# Patient Record
Sex: Male | Born: 1975 | Race: White | Hispanic: No | Marital: Married | Smoking: Never smoker
Health system: Southern US, Community
[De-identification: ages and names within clinical notes are randomized; demographics above are authoritative.]

## PROBLEM LIST (undated history)

## (undated) DIAGNOSIS — I10 Essential (primary) hypertension: Secondary | ICD-10-CM

## (undated) HISTORY — PX: APPENDECTOMY: SHX54

## (undated) HISTORY — PX: TONSILLECTOMY: SUR1361

---

## 2006-08-08 ENCOUNTER — Emergency Department: Payer: Self-pay | Admitting: Emergency Medicine

## 2006-08-17 ENCOUNTER — Emergency Department: Payer: Self-pay | Admitting: Emergency Medicine

## 2007-10-17 ENCOUNTER — Ambulatory Visit: Payer: Self-pay | Admitting: Family Medicine

## 2008-06-01 ENCOUNTER — Ambulatory Visit: Payer: Self-pay | Admitting: Gastroenterology

## 2008-06-01 LAB — HM COLONOSCOPY

## 2010-05-24 ENCOUNTER — Emergency Department: Payer: Self-pay | Admitting: Emergency Medicine

## 2010-12-22 ENCOUNTER — Ambulatory Visit: Payer: Self-pay | Admitting: Family Medicine

## 2010-12-29 ENCOUNTER — Observation Stay: Payer: Self-pay | Admitting: Surgery

## 2011-01-04 LAB — PATHOLOGY REPORT

## 2012-05-09 LAB — PSA: PSA: 1.1

## 2013-02-18 LAB — HEPATIC FUNCTION PANEL
ALT: 27 U/L (ref 10–40)
AST: 16 U/L (ref 14–40)
Alkaline Phosphatase: 66 U/L (ref 25–125)
Bilirubin, Total: 0.7 mg/dL

## 2013-02-18 LAB — LIPID PANEL
Cholesterol: 170 mg/dL (ref 0–200)
HDL: 31 mg/dL — AB (ref 35–70)
LDL CALC: 90 mg/dL
LDl/HDL Ratio: 2.9
Triglycerides: 243 mg/dL — AB (ref 40–160)

## 2013-02-18 LAB — CBC AND DIFFERENTIAL
HEMATOCRIT: 49 % (ref 41–53)
Hemoglobin: 17 g/dL (ref 13.5–17.5)
NEUTROS ABS: 58 /uL
PLATELETS: 239 10*3/uL (ref 150–399)
WBC: 8 10*3/mL

## 2013-02-18 LAB — BASIC METABOLIC PANEL
BUN: 15 mg/dL (ref 4–21)
CREATININE: 1 mg/dL (ref 0.6–1.3)
Glucose: 108 mg/dL
Potassium: 4.4 mmol/L (ref 3.4–5.3)
Sodium: 140 mmol/L (ref 137–147)

## 2013-02-18 LAB — TSH: TSH: 1.79 u[IU]/mL (ref 0.41–5.90)

## 2013-07-17 LAB — HEMOGLOBIN A1C: Hgb A1c MFr Bld: 5.5 % (ref 4.0–6.0)

## 2014-05-25 DIAGNOSIS — G4700 Insomnia, unspecified: Secondary | ICD-10-CM | POA: Insufficient documentation

## 2014-05-25 DIAGNOSIS — K5790 Diverticulosis of intestine, part unspecified, without perforation or abscess without bleeding: Secondary | ICD-10-CM | POA: Insufficient documentation

## 2014-05-25 DIAGNOSIS — E785 Hyperlipidemia, unspecified: Secondary | ICD-10-CM | POA: Insufficient documentation

## 2014-05-25 DIAGNOSIS — Z789 Other specified health status: Secondary | ICD-10-CM

## 2014-05-25 DIAGNOSIS — R5383 Other fatigue: Secondary | ICD-10-CM

## 2014-05-25 DIAGNOSIS — R5381 Other malaise: Secondary | ICD-10-CM | POA: Insufficient documentation

## 2014-05-25 DIAGNOSIS — R739 Hyperglycemia, unspecified: Secondary | ICD-10-CM | POA: Insufficient documentation

## 2014-05-25 DIAGNOSIS — F4320 Adjustment disorder, unspecified: Secondary | ICD-10-CM | POA: Insufficient documentation

## 2014-05-25 DIAGNOSIS — E669 Obesity, unspecified: Secondary | ICD-10-CM | POA: Insufficient documentation

## 2014-05-25 DIAGNOSIS — Z720 Tobacco use: Secondary | ICD-10-CM | POA: Insufficient documentation

## 2014-05-25 DIAGNOSIS — G4733 Obstructive sleep apnea (adult) (pediatric): Secondary | ICD-10-CM | POA: Insufficient documentation

## 2014-05-25 DIAGNOSIS — I10 Essential (primary) hypertension: Secondary | ICD-10-CM | POA: Insufficient documentation

## 2014-05-25 DIAGNOSIS — E291 Testicular hypofunction: Secondary | ICD-10-CM | POA: Insufficient documentation

## 2014-05-25 DIAGNOSIS — F419 Anxiety disorder, unspecified: Secondary | ICD-10-CM | POA: Insufficient documentation

## 2014-07-26 ENCOUNTER — Other Ambulatory Visit: Payer: Self-pay | Admitting: Family Medicine

## 2014-08-10 ENCOUNTER — Ambulatory Visit: Payer: Self-pay | Admitting: Family Medicine

## 2014-08-21 ENCOUNTER — Other Ambulatory Visit: Payer: Self-pay | Admitting: Family Medicine

## 2014-12-01 ENCOUNTER — Other Ambulatory Visit: Payer: Self-pay

## 2014-12-01 MED ORDER — AMLODIPINE-OLMESARTAN 5-40 MG PO TABS: 1.0000 | ORAL_TABLET | Freq: Every day | ORAL | Status: DC

## 2015-02-10 ENCOUNTER — Emergency Department: Payer: BLUE CROSS/BLUE SHIELD

## 2015-02-10 ENCOUNTER — Encounter: Payer: Self-pay | Admitting: Emergency Medicine

## 2015-02-10 ENCOUNTER — Emergency Department
Admission: EM | Admit: 2015-02-10 | Discharge: 2015-02-10 | Disposition: A | Discharge status: Home / Self Care | Payer: BLUE CROSS/BLUE SHIELD | Attending: Student | Admitting: Student

## 2015-02-10 DIAGNOSIS — Z79899 Other long term (current) drug therapy: Secondary | ICD-10-CM | POA: Insufficient documentation

## 2015-02-10 DIAGNOSIS — R42 Dizziness and giddiness: Secondary | ICD-10-CM

## 2015-02-10 DIAGNOSIS — Z7982 Long term (current) use of aspirin: Secondary | ICD-10-CM | POA: Diagnosis not present

## 2015-02-10 DIAGNOSIS — I10 Essential (primary) hypertension: Secondary | ICD-10-CM | POA: Diagnosis not present

## 2015-02-10 DIAGNOSIS — Z880 Allergy status to penicillin: Secondary | ICD-10-CM | POA: Insufficient documentation

## 2015-02-10 LAB — TROPONIN I: Troponin I: <0.03 ng/mL (ref <0.031)

## 2015-02-10 LAB — CBC
HCT: 48.4 % (ref 40.0–52.0)
HEMOGLOBIN: 16.5 g/dL (ref 13.0–18.0)
MCH: 29.8 pg (ref 26.0–34.0)
MCHC: 34.2 g/dL (ref 32.0–36.0)
MCV: 87.2 fL (ref 80.0–100.0)
PLATELETS: 221 10*3/uL (ref 150–440)
RBC: 5.55 MIL/uL (ref 4.40–5.90)
RDW: 13.6 % (ref 11.5–14.5)
WBC: 8.5 10*3/uL (ref 3.8–10.6)

## 2015-02-10 LAB — BASIC METABOLIC PANEL
ANION GAP: 7 (ref 5–15)
BUN: 16 mg/dL (ref 6–20)
CHLORIDE: 105 mmol/L (ref 101–111)
CO2: 25 mmol/L (ref 22–32)
Calcium: 8.9 mg/dL (ref 8.9–10.3)
Creatinine, Ser: 1.03 mg/dL (ref 0.61–1.24)
GFR calc Af Amer: >60 mL/min (ref >60)
Glucose, Bld: 155 mg/dL — ABNORMAL HIGH (ref 65–99)
POTASSIUM: 3.5 mmol/L (ref 3.5–5.1)
SODIUM: 137 mmol/L (ref 135–145)

## 2015-02-10 MED ORDER — CLONIDINE HCL 0.1 MG PO TABS
0.1000 mg | ORAL_TABLET | Freq: Once | ORAL | Status: DC
  Filled 2015-02-10: qty 1

## 2015-02-10 MED ORDER — LORAZEPAM 1 MG PO TABS
1.0000 mg | ORAL_TABLET | Freq: Once | ORAL | Status: AC
  Administered 2015-02-10: 1 mg via ORAL
  Filled 2015-02-10: qty 1

## 2015-02-10 NOTE — ED Notes (Signed)
Pt to ed with c/o dizziness acute onset just pta.  Pt reports chest pain 2/10, sob, weakness and mild confusion.  EKG done. Pt states feeling of dizziness is intermittent.

## 2015-02-10 NOTE — ED Notes (Signed)
Pt reports sitting at his home office desk and began getting progressively "whoozy" and legs were tingling, heart palpitations.  Pt denies confusion and reports a witness denied slurred speech.

## 2015-02-10 NOTE — ED Provider Notes (Signed)
[facility] Emergency Department Provider Note  ____________________________________________  Time seen: Approximately 1:54 PM  I have reviewed the triage vital signs and the nursing notes.   HISTORY  Chief Complaint Dizziness    HPI Gabriel Larson is a 40 y.o. male history of hypertension, anxiety, depression who presents for evaluation of lightheadedness today, gradual onset just prior to arrival, currently resolved, no modifying factors. The patient reports that soon after he ate right fist this morning and drank 2 cups of coffee he began experiencing some lightheadedness and felt as if he could not focus. He reports "I felt stoned". He also had some "burning in my lungs" which she reports he gets often when "I need a cigarette" denies any specific chest pain. No shortness of breath. No headache, numbness, weakness, vision changes. He reports that he has been compliant with his medications. No recent illness including no cough, vomiting, diarrhea, fevers or chills. No abdominal pain.   History reviewed. No pertinent past medical history.  Patient Active Problem List   Diagnosis Date Noted  . Adaptation reaction 05/25/2014  . Anxiety 05/25/2014  . Dips tobacco 05/25/2014  . 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 Dyslipidemia 05/25/2014  . Essential (primary) hypertension 05/25/2014  . Blood glucose elevated 05/25/2014  . Cannot sleep 05/25/2014  . Malaise and fatigue 05/25/2014  . Adiposity 05/25/2014  . Obstructive apnea 05/25/2014  . 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 Appendectomy    . Tonsillectomy      Current Outpatient Rx  Name  Route  Sig  Dispense  Refill  . amLODipine-olmesartan (AZOR) 5-40 MG tablet   Oral   Take 1 tablet by mouth daily.   30 tablet   6   . aspirin EC 81 MG tablet   Oral   Take 81 mg by mouth daily.         . MULTIPLE VITAMIN PO   Oral   Take 1  tablet by mouth daily.         Marland Kitchen venlafaxine XR (EFFEXOR-XR) 75 MG 24 hr capsule      TAKE ONE CAPSULE BY MOUTH ONE TIME DAILY   30 capsule   8     Allergies Erythromycin; Metronidazole; and Penicillins  Family History  Problem Relation Age of Onset  . Hypertension Mother   . Diabetes Mother   . Heart disease Mother     CAD  . Cancer Mother     Bone and Multiple Myeloma  . Kidney disease Mother   . Heart disease Father     Died from MI  . Hypertension Father   . Diabetes Father   . Ulcers Father   . Kidney disease Sister     Social History Social History  Substance Use Topics  . Smoking status: Never Smoker   . Smokeless tobacco: None  . Alcohol Use: Yes    Review of Systems Constitutional: No fever/chills Eyes: No visual changes. ENT: No sore throat. Cardiovascular: Denies chest pain. Respiratory: Denies shortness of breath. Gastrointestinal: No abdominal pain.  No nausea, no vomiting.  No diarrhea.  No constipation. Genitourinary: Negative for dysuria. Musculoskeletal: Negative for back pain. Skin: Negative for rash. Neurological: Negative for headaches, focal weakness or numbness.  10-point ROS otherwise negative.  ____________________________________________   PHYSICAL EXAM:  VITAL SIGNS: ED Triage Vitals  Enc Vitals Group     BP 02/10/15 1210 192/107 mmHg     Pulse Rate  02/10/15 1210 84     Resp 02/10/15 1210 16     Temp 02/10/15 1210 98.3 F (36.8 C)     Temp Source 02/10/15 1210 Oral     SpO2 02/10/15 1210 99 %     Weight 02/10/15 1210 240 lb (108.863 kg)     Height 02/10/15 1210 '5\' 10"'$  (1.778 m)     Head Cir --      Peak Flow --      Pain Score 02/10/15 1210 2     Pain Loc --      Pain Edu? --      Excl. in [place]? --     Constitutional: Alert and oriented. Well appearing and in no acute distress. Eyes: Conjunctivae are normal. PERRL. EOMI. Head: Atraumatic. Nose: No congestion/rhinnorhea. Mouth/Throat: Mucous membranes are moist.   Oropharynx non-erythematous. Neck: No stridor.   Cardiovascular: Normal rate, regular rhythm. Grossly normal heart sounds.  Good peripheral circulation. Respiratory: Normal respiratory effort.  No retractions. Lungs CTAB. Gastrointestinal: Soft and nontender. No distention.  No CVA tenderness. Genitourinary: deferred Musculoskeletal: No lower extremity tenderness nor edema.  No joint effusions. Neurologic:  Normal speech and language. No gross focal neurologic deficits are appreciated. No gait instability. 5 out of 5 strength in bilateral upper and lower extremities, sensation intact to light touch throughout, cranial nerves II through XII intact, normal finger-nose-finger. Skin:  Skin is warm, dry and intact. No rash noted. Psychiatric: Mood and affect are normal. Speech and behavior are normal.  ____________________________________________   LABS (all labs ordered are listed, but only abnormal results are displayed)  Labs Reviewed  BASIC METABOLIC PANEL - Abnormal; Notable for the following:    Glucose, Bld 155 (*)    All other components within normal limits  CBC  TROPONIN I   ____________________________________________  EKG  ED ECG REPORT I, Joanne Gavel, the attending physician, personally viewed and interpreted this ECG.   Date: 02/10/2015  EKG Time: 12:16  Rate: 79  Rhythm: normal sinus rhythm with sinus arrhythmia, normal EKG  Axis: normal  Intervals:none  ST&T Change: No acute ST elevation.  ____________________________________________  RADIOLOGY  CXR  IMPRESSION: No active cardiopulmonary disease.   CT head FINDINGS: The brainstem, cerebellum, cerebral peduncles, thalami, basal ganglia, basilar cisterns, and ventricular system appear within normal limits. No intracranial hemorrhage, mass lesion, or acute CVA.  IMPRESSION: 1. No significant intracranial abnormality  observed. ____________________________________________   PROCEDURES  Procedure(s) performed: None  Critical Care performed: No  ____________________________________________   INITIAL IMPRESSION / ASSESSMENT AND PLAN / ED COURSE  Pertinent labs & imaging results that were available during my care of the patient were reviewed by me and considered in my medical decision making (see chart for details).  Gabriel Larson is a 40 y.o. male history of hypertension, anxiety, depression who presents for evaluation of lightheadedness today, gradual onset just prior to arrival, currently resolved. On exam, he is in no acute distress. He is hypertensive with blood pressure 192/107. The remainder of his vital signs are stable, he is afebrile. He has a benign physical examination as well as an intact neurological examination. EKG normal, not consistent with ischemia. His symptoms may be related to his hypertension today. We'll obtain screening labs, chest x-ray CT head. We'll reassess his blood pressure and give when necessary antihypertensives as needed. Reassess for disposition.  ----------------------------------------- 3:53 PM on 02/10/2015 -----------------------------------------  Labs review. Unremarkable CBC, BMP, troponin. CT head negative for any acute intracranial process. Chest  x-ray shows no acute cardiopulmonary abnormality. The patient's blood pressure has improved to 144/96 with just 1 mg of by mouth Ativan. He continues to feel well, no chest pain, no lightheadedness. We discussed he will take his blood pressure medicine as prescribed and follow-up with his primary care doctor within the next few days for recheck of his blood pressure. I discussed meticulous return precautions as well as immediate return precautions and the patient is comfortable with the discharge plan. DC home. ____________________________________________   FINAL CLINICAL IMPRESSION(S) / ED DIAGNOSES  Final  diagnoses:  Lightheadedness  Essential hypertension      Joanne Gavel, MD 02/10/15 1554

## 2015-02-11 ENCOUNTER — Ambulatory Visit (INDEPENDENT_AMBULATORY_CARE_PROVIDER_SITE_OTHER): Payer: BLUE CROSS/BLUE SHIELD | Admitting: Family Medicine

## 2015-02-11 ENCOUNTER — Encounter: Payer: Self-pay | Admitting: Family Medicine

## 2015-02-11 VITALS — BP 152/90 | HR 72 | Temp 98.8°F | Resp 16 | Wt 238.0 lb

## 2015-02-11 DIAGNOSIS — I10 Essential (primary) hypertension: Secondary | ICD-10-CM

## 2015-02-11 DIAGNOSIS — F329 Major depressive disorder, single episode, unspecified: Secondary | ICD-10-CM | POA: Insufficient documentation

## 2015-02-11 DIAGNOSIS — F32A Depression, unspecified: Secondary | ICD-10-CM | POA: Insufficient documentation

## 2015-02-11 MED ORDER — METOPROLOL SUCCINATE ER 25 MG PO TB24: 25.0000 mg | ORAL_TABLET | Freq: Every day | ORAL | Status: DC

## 2015-02-11 NOTE — Progress Notes (Signed)
Patient ID: Gabriel Larson, male   DOB: May 17, 1975, 40 y.o.   MRN: [id number]    Subjective:  HPI Pt was seen in the ED yesterday for dizziness. He reports that he sudden felt like he had drank a bottle of liquor. He got someone to take him to the hospital. They did EKG blood work and a EKG and he BP was elevated to 192/97. They told him his dizziness was from the BP being so high. All exams were normal and all test were normal. He was given an Ativan and his BP returned to 144/96. When he left the ED he had not dizziness. He still is not having any dizziness. He reports that he has noticed that his blood pressures have been creeping up a little when he checked it at home.  Prior to Admission medications   Medication Sig Start Date End Date Taking? Authorizing Provider  amLODipine-olmesartan (AZOR) 5-40 MG tablet Take 1 tablet by mouth daily. 12/01/14  Yes Sajjad Honea Maceo Pro., MD  aspirin EC 81 MG tablet Take 81 mg by mouth daily.   Yes Historical Provider, MD  MULTIPLE VITAMIN PO Take 1 tablet by mouth daily.   Yes Historical Provider, MD  venlafaxine XR (EFFEXOR-XR) 75 MG 24 hr capsule TAKE ONE CAPSULE BY MOUTH ONE TIME DAILY 07/27/14  Yes Jerrol Banana., MD    Patient Active Problem List   Diagnosis Date Noted  . 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 Adaptation reaction 05/25/2014  . Anxiety 05/25/2014  . Dips tobacco 05/25/2014  . 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 Dyslipidemia 05/25/2014  . Essential (primary) hypertension 05/25/2014  . Blood glucose elevated 05/25/2014  . Cannot sleep 05/25/2014  . Malaise and fatigue 05/25/2014  . Adiposity 05/25/2014  . Obstructive apnea 05/25/2014  . 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 No pertinent past medical history.  Social History   Social History  . Marital Status: Married    Spouse Name: N/A  . Number of Children: N/A  . Years of Education: N/A   Occupational History  . Not on file.   Social  History Main Topics  . Smoking status: Never Smoker   . Smokeless tobacco: Current User    Types: Chew  . Alcohol Use: Yes  . Drug Use: No  . Sexual Activity: Not on file   Other Topics Concern  . Not on file   Social History Narrative    Allergies  Allergen Reactions  . Erythromycin     GI issues  . Metronidazole Other (See Comments)    GI issues  . Penicillins     Review of Systems  Constitutional: Negative.   HENT: Negative.   Eyes: Negative.   Respiratory: Negative.   Cardiovascular: Negative.   Gastrointestinal: Negative.   Genitourinary: Negative.   Musculoskeletal: Negative.   Skin: Negative.   Neurological: Negative.   Endo/Heme/Allergies: Negative.   Psychiatric/Behavioral: Negative.     Immunization History  Administered Date(s) Administered  . DTaP 02/03/1974, 04/02/1974, 06/06/1974, 06/10/1975, 07/18/1979  . Hepatitis A 08/29/2005, 10/04/2005, 03/07/2006  . Hepatitis B 08/29/2005, 10/04/2005, 03/07/2006  . IPV 02/03/1974, 04/02/1974, 06/06/1974, 10/10/1975, 07/18/1979  . Influenza-Unspecified 11/10/2014  . MMR 11/03/1974  . Td 08/26/2002  . Tdap 01/17/2012  . Typhoid Inactivated 08/29/2005   Objective:  BP 152/90 mmHg  Pulse 72  Temp(Src) 98.8 F (37.1 C) (Oral)  Resp 16  Wt 238 lb (107.956 kg)  SpO2 99%  Physical Exam  Constitutional: He is oriented  to person, place, and time and well-developed, well-nourished, and in no distress.  HENT:  Head: Normocephalic and atraumatic.  Left Ear: External ear normal.  Nose: Nose normal.  Eyes: Conjunctivae and EOM are normal. Pupils are equal, round, and reactive to light.  Neck: Normal range of motion. Neck supple.  Cardiovascular: Normal rate, regular rhythm, normal heart sounds and intact distal pulses.   Pulmonary/Chest: Effort normal and breath sounds normal.  Abdominal: Soft.  Musculoskeletal: Normal range of motion.  Neurological: He is alert and oriented to person, place, and time. He  has normal reflexes. Gait normal. GCS score is 15.  Skin: Skin is warm and dry.  Psychiatric: Mood, memory, affect and judgment normal.    Lab Results  Component Value Date   WBC 8.5 02/10/2015   HGB 16.5 02/10/2015   HCT 48.4 02/10/2015   PLT 221 02/10/2015   GLUCOSE 155* 02/10/2015   CHOL 170 02/18/2013   TRIG 243* 02/18/2013   HDL 31* 02/18/2013   LDLCALC 90 02/18/2013   TSH 1.79 02/18/2013   PSA 1.1 05/09/2012   HGBA1C 5.5 07/17/2013    CMP     Component Value Date/Time   NA 137 02/10/2015 1223   NA 140 02/18/2013   K 3.5 02/10/2015 1223   CL 105 02/10/2015 1223   CO2 25 02/10/2015 1223   GLUCOSE 155* 02/10/2015 1223   BUN 16 02/10/2015 1223   BUN 15 02/18/2013   CREATININE 1.03 02/10/2015 1223   CREATININE 1.0 02/18/2013   CALCIUM 8.9 02/10/2015 1223   AST 16 02/18/2013   ALT 27 02/18/2013   ALKPHOS 66 02/18/2013   GFRNONAA >60 02/10/2015 1223   GFRAA >60 02/10/2015 1223    Assessment and Plan :  1. Essential (primary) hypertension Seen in ED for this. Start Metoprolol as below, Follow up in 1 month. - metoprolol succinate (TOPROL-XL) 25 MG 24 hr tablet; Take 1 tablet (25 mg total) by mouth daily.  Dispense: 30 tablet; Refill: 12 Discussed doubling amlodipine to 10 mg but this caused severe swelling when this was done several years ago. Consider HCTZ as the next step on this young patient Patient was seen and examined by Dr. Miguel Aschoff, and noted scribed by Webb Laws, CMA I have done the exam and reviewed the above chart and it is accurate to the best of my knowledge.  Miguel Aschoff MD [facility] 02/11/2015 10:43 AM

## 2015-03-09 ENCOUNTER — Ambulatory Visit (INDEPENDENT_AMBULATORY_CARE_PROVIDER_SITE_OTHER): Payer: BLUE CROSS/BLUE SHIELD | Admitting: Family Medicine

## 2015-03-09 ENCOUNTER — Encounter: Payer: Self-pay | Admitting: Family Medicine

## 2015-03-09 VITALS — BP 162/84 | HR 76 | Temp 97.5°F | Resp 16 | Wt 237.0 lb

## 2015-03-09 DIAGNOSIS — M7521 Bicipital tendinitis, right shoulder: Secondary | ICD-10-CM | POA: Diagnosis not present

## 2015-03-09 DIAGNOSIS — I10 Essential (primary) hypertension: Secondary | ICD-10-CM | POA: Diagnosis not present

## 2015-03-09 MED ORDER — METOPROLOL SUCCINATE ER 50 MG PO TB24: 50.0000 mg | ORAL_TABLET | Freq: Every day | ORAL | Status: DC

## 2015-03-09 MED ORDER — NAPROXEN 500 MG PO TABS: 500.0000 mg | ORAL_TABLET | Freq: Two times a day (BID) | ORAL | Status: DC

## 2015-03-09 NOTE — Progress Notes (Signed)
Patient ID: Gabriel Larson, male   DOB: [birth year], 40 y.o.   MRN: [id number]    Subjective:  HPI  Hypertension, follow-up:  BP Readings from Last 3 Encounters:  03/09/15 162/84  02/11/15 152/90  02/10/15 130/94    He was last seen for hypertension 4 weeks ago.  BP at that visit was 152/90. Management since that visit includes started Metoprolol. He reports good compliance with treatment. He is having side effects. Only if he takes it at night. If he takes it in the am he feels tired. He is exercising. He is adherent to low salt diet.   Outside blood pressures are 150's/80's. He is experiencing none.  Patient denies chest pain, chest pressure/discomfort, claudication, dyspnea, exertional chest pressure/discomfort, irregular heart beat, lower extremity edema and near-syncope.    Wt Readings from Last 3 Encounters:  03/09/15 237 lb (107.502 kg)  02/11/15 238 lb (107.956 kg)  02/10/15 240 lb (108.863 kg)   ------------------------------------------------------------------------ He has been having right shoulder pain. He cut down a tree last weekend and it has been hurting every since. He reports that he can not raise his arm all the way up with out pain.      Prior to Admission medications   Medication Sig Start Date End Date Taking? Authorizing Provider  amLODipine-olmesartan (AZOR) 5-40 MG tablet Take 1 tablet by mouth daily. 12/01/14  Yes Zea Kostka Maceo Pro., MD  aspirin EC 81 MG tablet Take 81 mg by mouth daily.   Yes Historical Provider, MD  metoprolol succinate (TOPROL-XL) 25 MG 24 hr tablet Take 1 tablet (25 mg total) by mouth daily. 02/11/15  Yes Kenna Kirn Maceo Pro., MD  MULTIPLE VITAMIN PO Take 1 tablet by mouth daily.   Yes Historical Provider, MD  venlafaxine XR (EFFEXOR-XR) 75 MG 24 hr capsule TAKE ONE CAPSULE BY MOUTH ONE TIME DAILY 07/27/14  Yes Jerrol Banana., MD    Patient Active Problem List   Diagnosis Date Noted  . Clinical depression  02/11/2015  . Adaptation reaction 05/25/2014  . Anxiety 05/25/2014  . Dips tobacco 05/25/2014  . 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 Dyslipidemia 05/25/2014  . Essential (primary) hypertension 05/25/2014  . Blood glucose elevated 05/25/2014  . Cannot sleep 05/25/2014  . Malaise and fatigue 05/25/2014  . Adiposity 05/25/2014  . Obstructive apnea 05/25/2014  . 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 No pertinent past medical history.  Social History   Social History  . Marital Status: Married    Spouse Name: N/A  . Number of Children: N/A  . Years of Education: N/A   Occupational History  . Not on file.   Social History Main Topics  . Smoking status: Never Smoker   . Smokeless tobacco: Current User    Types: Chew  . Alcohol Use: Yes  . Drug Use: No  . Sexual Activity: Not on file   Other Topics Concern  . Not on file   Social History Narrative    Allergies  Allergen Reactions  . Erythromycin     GI issues  . Metronidazole Other (See Comments)    GI issues  . Penicillins     Review of Systems  Constitutional: Negative.   HENT: Negative.   Eyes: Negative.   Respiratory: Negative.   Cardiovascular: Negative.   Gastrointestinal: Negative.   Genitourinary: Negative.   Musculoskeletal: Positive for joint pain.  Skin: Negative.   Neurological: Negative.   Endo/Heme/Allergies: Negative.   Psychiatric/Behavioral: Negative.  Immunization History  Administered Date(s) Administered  . DTaP 02/03/1974, 04/02/1974, 06/06/1974, 06/10/1975, 07/18/1979  . Hepatitis A 08/29/2005, 10/04/2005, 03/07/2006  . Hepatitis B 08/29/2005, 10/04/2005, 03/07/2006  . IPV 02/03/1974, 04/02/1974, 06/06/1974, 10/10/1975, 07/18/1979  . Influenza-Unspecified 11/10/2014  . MMR 11/03/1974  . Td 08/26/2002  . Tdap 01/17/2012  . Typhoid Inactivated 08/29/2005   Objective:  BP 162/84 mmHg  Pulse 76  Temp(Src) 97.5 F (36.4 C) (Oral)  Resp 16  Wt 237 lb  (107.502 kg)  Physical Exam  Constitutional: He is oriented to person, place, and time and well-developed, well-nourished, and in no distress.  Mildly obese white male in no acute distress.  HENT:  Head: Normocephalic and atraumatic.  Right Ear: External ear normal.  Left Ear: External ear normal.  Nose: Nose normal.  Eyes: Conjunctivae are normal.  Neck: Neck supple.  Cardiovascular: Normal rate, regular rhythm and normal heart sounds.   Pulmonary/Chest: Effort normal and breath sounds normal.  Abdominal: Soft.  Musculoskeletal: He exhibits tenderness.  Abduction at 90 degrees but beyond that is painful.he has anterior right shoulder tenderness. Cervical he seems to be tender over the right biceps tendon.  Neurological: He is alert and oriented to person, place, and time. He has normal reflexes. Gait normal. GCS score is 15.  Skin: Skin is warm and dry.  Psychiatric: Mood, memory, affect and judgment normal.    Lab Results  Component Value Date   WBC 8.5 02/10/2015   HGB 16.5 02/10/2015   HCT 48.4 02/10/2015   PLT 221 02/10/2015   GLUCOSE 155* 02/10/2015   CHOL 170 02/18/2013   TRIG 243* 02/18/2013   HDL 31* 02/18/2013   LDLCALC 90 02/18/2013   TSH 1.79 02/18/2013   PSA 1.1 05/09/2012   HGBA1C 5.5 07/17/2013    CMP     Component Value Date/Time   NA 137 02/10/2015 1223   NA 140 02/18/2013   K 3.5 02/10/2015 1223   CL 105 02/10/2015 1223   CO2 25 02/10/2015 1223   GLUCOSE 155* 02/10/2015 1223   BUN 16 02/10/2015 1223   BUN 15 02/18/2013   CREATININE 1.03 02/10/2015 1223   CREATININE 1.0 02/18/2013   CALCIUM 8.9 02/10/2015 1223   AST 16 02/18/2013   ALT 27 02/18/2013   ALKPHOS 66 02/18/2013   GFRNONAA >60 02/10/2015 1223   GFRAA >60 02/10/2015 1223    Assessment and Plan :  1. Essential (primary) hypertension Still not to goal. Follow up in 1-2 months.   - metoprolol succinate (TOPROL-XL) 50 MG 24 hr tablet; Take 1 tablet (50 mg total) by mouth daily.   Dispense: 30 tablet; Refill: 12  2. Biceps tendonitis, right/right impingement syndrome of shoulder May be impingement. Treat with Naproxen and follow up 1-2 months.   - naproxen (NAPROSYN) 500 MG tablet; Take 1 tablet (500 mg total) by mouth 2 (two) times daily with a meal.  Dispense: 60 tablet; Refill: 2 3 mild obesity Patient was seen and examined by Dr. Miguel Aschoff, and noted scribed by Webb Laws, [place] MD [facility] 03/09/2015 3:55 PM

## 2015-03-23 ENCOUNTER — Telehealth: Payer: Self-pay | Admitting: Emergency Medicine

## 2015-03-23 DIAGNOSIS — I10 Essential (primary) hypertension: Secondary | ICD-10-CM

## 2015-03-23 MED ORDER — METOPROLOL SUCCINATE ER 100 MG PO TB24: 100.0000 mg | ORAL_TABLET | Freq: Every day | ORAL | Status: DC

## 2015-03-23 NOTE — Telephone Encounter (Signed)
Pt called in and spoke with the call a nurse early yesterday morning. He stated that after the blood pressure medication change, his blood pressure was still running no less than 150/100 and as high as 180/110. Last OV on 03/09/15 we increased Metoprolol to 50 mg.  Please advise.

## 2015-03-23 NOTE — Telephone Encounter (Signed)
Pt advised, RX sent in for 100 mg and advised patient to take 2 tablets of 50 mg-aa

## 2015-03-23 NOTE — Telephone Encounter (Signed)
Increase Metoprolol succinate tyo 100mg  daily--he can take 2 of 50 that he has now.

## 2015-04-26 ENCOUNTER — Other Ambulatory Visit: Payer: Self-pay | Admitting: Family Medicine

## 2015-05-10 ENCOUNTER — Encounter: Payer: Self-pay | Admitting: Family Medicine

## 2015-05-10 ENCOUNTER — Ambulatory Visit (INDEPENDENT_AMBULATORY_CARE_PROVIDER_SITE_OTHER): Payer: BLUE CROSS/BLUE SHIELD | Admitting: Family Medicine

## 2015-05-10 VITALS — BP 148/96 | HR 62 | Resp 14 | Wt 233.0 lb

## 2015-05-10 DIAGNOSIS — F419 Anxiety disorder, unspecified: Secondary | ICD-10-CM

## 2015-05-10 DIAGNOSIS — R739 Hyperglycemia, unspecified: Secondary | ICD-10-CM

## 2015-05-10 DIAGNOSIS — E669 Obesity, unspecified: Secondary | ICD-10-CM | POA: Diagnosis not present

## 2015-05-10 DIAGNOSIS — I10 Essential (primary) hypertension: Secondary | ICD-10-CM | POA: Diagnosis not present

## 2015-05-10 DIAGNOSIS — E785 Hyperlipidemia, unspecified: Secondary | ICD-10-CM | POA: Diagnosis not present

## 2015-05-10 MED ORDER — VENLAFAXINE HCL ER 150 MG PO CP24: 150.0000 mg | ORAL_CAPSULE | Freq: Every day | ORAL | Status: DC

## 2015-05-10 NOTE — Progress Notes (Signed)
Patient ID: Gabriel Larson, male   DOB: [birth year], 40 y.o.   MRN: [id number]    Subjective:  HPI  Patient is here for 2 months follow up. B/P his Metoprolol was increased to 50 mg and then on March 14th he called and his b/p was still high and we increased medication to 100 mg. Patient states b/p still running high 150/105. 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 He has had mild anxiety in the past but it is getting a lot worse lately. He is having no cardiac or neurologic symptoms.  Prior to Admission medications   Medication Sig Start Date End Date Taking? Authorizing Provider  amLODipine-olmesartan (AZOR) 5-40 MG tablet Take 1 tablet by mouth daily. 12/01/14  Yes Keith Cancio Maceo Pro., MD  aspirin EC 81 MG tablet Take 81 mg by mouth daily.   Yes Historical Provider, MD  metoprolol succinate (TOPROL-XL) 100 MG 24 hr tablet Take 1 tablet (100 mg total) by mouth daily. 03/23/15  Yes Marcy Sookdeo Maceo Pro., MD  MULTIPLE VITAMIN PO Take 1 tablet by mouth daily.   Yes Historical Provider, MD  venlafaxine XR (EFFEXOR-XR) 75 MG 24 hr capsule TAKE ONE CAPSULE BY MOUTH ONE TIME DAILY 04/26/15  Yes Jerrol Banana., MD    Patient Active Problem List   Diagnosis Date Noted  . 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 Adaptation reaction 05/25/2014  . Anxiety 05/25/2014  . Dips tobacco 05/25/2014  . 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 Dyslipidemia 05/25/2014  . Essential (primary) hypertension 05/25/2014  . Blood glucose elevated 05/25/2014  . Cannot sleep 05/25/2014  . Malaise and fatigue 05/25/2014  . Adiposity 05/25/2014  . Obstructive apnea 05/25/2014  . 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  Social History   Social History  . Marital Status: Married    Spouse Name: N/A  . Number of Children: N/A  . Years of Education: N/A     Occupational History  . Not on file.   Social History Main Topics  . Smoking status: Never Smoker   . Smokeless tobacco: Current User    Types: Chew  . Alcohol Use: Yes  . Drug Use: No  . Sexual Activity: Not on file   Other Topics Concern  . Not on file   Social History Narrative    Allergies  Allergen Reactions  . Erythromycin     GI issues  . Metronidazole Other (See Comments)    GI issues  . Penicillins     Review of Systems  Constitutional: Negative.   HENT: Negative.   Eyes: Negative.   Respiratory: Negative.   Cardiovascular: Negative.   Gastrointestinal: Negative.   Musculoskeletal: Negative.   Skin: Negative.   Neurological: Negative.   Endo/Heme/Allergies: Negative.   Psychiatric/Behavioral: The patient is nervous/anxious.     Immunization History  Administered Date(s) Administered  . DTaP 02/03/1974, 04/02/1974, 06/06/1974, 06/10/1975, 07/18/1979  . Hepatitis A 08/29/2005, 10/04/2005, 03/07/2006  . Hepatitis B 08/29/2005, 10/04/2005, 03/07/2006  . IPV 02/03/1974, 04/02/1974, 06/06/1974, 10/10/1975, 07/18/1979  . Influenza-Unspecified 11/10/2014  . MMR 11/03/1974  . Td 08/26/2002  . Tdap 01/17/2012  . Typhoid Inactivated 08/29/2005   Objective:  BP 148/96 mmHg  Pulse 62  Resp 14  Wt 233 lb (105.688 kg)  Physical Exam  Constitutional: He is oriented to person, place, and  time and well-developed, well-nourished, and in no distress.  HENT:  Head: Normocephalic and atraumatic.  Right Ear: External ear normal.  Left Ear: External ear normal.  Nose: Nose normal.  Eyes: Conjunctivae are normal. Pupils are equal, round, and reactive to light.  Neck: Normal range of motion. Neck supple.  Cardiovascular: Normal rate, regular rhythm, normal heart sounds and intact distal pulses.   No murmur heard. Pulmonary/Chest: Effort normal and breath sounds normal.  Abdominal: Soft.  Musculoskeletal: He exhibits no edema or tenderness.  Neurological: He  is alert and oriented to person, place, and time.  Skin: Skin is warm and dry.  Psychiatric: Mood, memory, affect and judgment normal.    Lab Results  Component Value Date   WBC 8.5 02/10/2015   HGB 16.5 02/10/2015   HCT 48.4 02/10/2015   PLT 221 02/10/2015   GLUCOSE 155* 02/10/2015   CHOL 170 02/18/2013   TRIG 243* 02/18/2013   HDL 31* 02/18/2013   LDLCALC 90 02/18/2013   TSH 1.79 02/18/2013   PSA 1.1 05/09/2012   HGBA1C 5.5 07/17/2013    CMP     Component Value Date/Time   NA 137 02/10/2015 1223   NA 140 02/18/2013   K 3.5 02/10/2015 1223   CL 105 02/10/2015 1223   CO2 25 02/10/2015 1223   GLUCOSE 155* 02/10/2015 1223   BUN 16 02/10/2015 1223   BUN 15 02/18/2013   CREATININE 1.03 02/10/2015 1223   CREATININE 1.0 02/18/2013   CALCIUM 8.9 02/10/2015 1223   AST 16 02/18/2013   ALT 27 02/18/2013   ALKPHOS 66 02/18/2013   GFRNONAA >60 02/10/2015 1223   GFRAA >60 02/10/2015 1223    Assessment and Plan :  1. Essential (primary) hypertension Not at goal still-slightly elevated. This is worrying patient and discussed this in detail. Will refer to nephrologist to make sure there is not a secondary cause that is causing his b/p to not be controlled. Patient understands and is with agreement with current plan. - CBC with Differential/Platelet - Comprehensive metabolic panel - Ambulatory referral to Nephrology  2. Dyslipidemia Check levels. - Comprehensive metabolic panel - Lipid Panel With LDL/HDL Ratio  3. Blood glucose elevated/Prediabetes - HgB A1c  4. Adiposity - TSH  5. Acute anxiety Worsening. Will check labs to make sure TSH is not contributing to this issue. Will increase Venlafaxine dose. Advised patient that anxiety can increase b/p reading up. - TSH Patient not suicidal or homicidal. May need referral to Counseling or psychiatry in the future. I have done the exam and reviewed the above chart and it is accurate to the best of my knowledge.  Patient  was seen and examined by Dr. Eulas Post and note was scribed by Theressa Millard, RMA.  Miguel Aschoff MD [facility] 05/10/2015 4:12 PM

## 2015-05-24 LAB — COMPREHENSIVE METABOLIC PANEL
ALBUMIN: 4.8 g/dL (ref 3.5–5.5)
ALT: 21 IU/L (ref 0–44)
AST: 21 IU/L (ref 0–40)
Albumin/Globulin Ratio: 2.3 — ABNORMAL HIGH (ref 1.2–2.2)
Alkaline Phosphatase: 67 IU/L (ref 39–117)
BILIRUBIN TOTAL: 1 mg/dL (ref 0.0–1.2)
BUN / CREAT RATIO: 11 (ref 9–20)
BUN: 14 mg/dL (ref 6–24)
CALCIUM: 8.9 mg/dL (ref 8.7–10.2)
CHLORIDE: 99 mmol/L (ref 96–106)
CO2: 25 mmol/L (ref 18–29)
CREATININE: 1.32 mg/dL — AB (ref 0.76–1.27)
GFR calc non Af Amer: 67 mL/min/{1.73_m2} (ref >59)
GFR, EST AFRICAN AMERICAN: 77 mL/min/{1.73_m2} (ref >59)
GLUCOSE: 107 mg/dL — AB (ref 65–99)
Globulin, Total: 2.1 g/dL (ref 1.5–4.5)
Potassium: 4.3 mmol/L (ref 3.5–5.2)
Sodium: 143 mmol/L (ref 134–144)
TOTAL PROTEIN: 6.9 g/dL (ref 6.0–8.5)

## 2015-05-24 LAB — CBC WITH DIFFERENTIAL/PLATELET
BASOS ABS: 0 10*3/uL (ref 0.0–0.2)
Basos: 0 %
EOS (ABSOLUTE): 0.2 10*3/uL (ref 0.0–0.4)
EOS: 3 %
HEMATOCRIT: 45.9 % (ref 37.5–51.0)
HEMOGLOBIN: 16.2 g/dL (ref 12.6–17.7)
Immature Grans (Abs): 0 10*3/uL (ref 0.0–0.1)
Immature Granulocytes: 0 %
LYMPHS ABS: 2 10*3/uL (ref 0.7–3.1)
Lymphs: 28 %
MCH: 30.7 pg (ref 26.6–33.0)
MCHC: 35.3 g/dL (ref 31.5–35.7)
MCV: 87 fL (ref 79–97)
MONOCYTES: 8 %
Monocytes Absolute: 0.6 10*3/uL (ref 0.1–0.9)
NEUTROS ABS: 4.3 10*3/uL (ref 1.4–7.0)
Neutrophils: 61 %
PLATELETS: 217 10*3/uL (ref 150–379)
RBC: 5.27 x10E6/uL (ref 4.14–5.80)
RDW: 14.5 % (ref 12.3–15.4)
WBC: 7.1 10*3/uL (ref 3.4–10.8)

## 2015-05-24 LAB — LIPID PANEL WITH LDL/HDL RATIO
CHOLESTEROL TOTAL: 142 mg/dL (ref 100–199)
HDL: 30 mg/dL — AB (ref >39)
LDL CALC: 78 mg/dL (ref 0–99)
LDL/HDL RATIO: 2.6 ratio (ref 0.0–3.6)
TRIGLYCERIDES: 169 mg/dL — AB (ref 0–149)
VLDL Cholesterol Cal: 34 mg/dL (ref 5–40)

## 2015-05-24 LAB — HEMOGLOBIN A1C
Est. average glucose Bld gHb Est-mCnc: 117 mg/dL
HEMOGLOBIN A1C: 5.7 % — AB (ref 4.8–5.6)

## 2015-05-24 LAB — TSH: TSH: 2.47 u[IU]/mL (ref 0.450–4.500)

## 2015-07-19 ENCOUNTER — Ambulatory Visit (INDEPENDENT_AMBULATORY_CARE_PROVIDER_SITE_OTHER): Payer: BLUE CROSS/BLUE SHIELD | Admitting: Family Medicine

## 2015-07-19 ENCOUNTER — Encounter: Payer: Self-pay | Admitting: Family Medicine

## 2015-07-19 VITALS — BP 152/88 | HR 76 | Temp 97.8°F | Resp 18 | Wt 244.0 lb

## 2015-07-19 DIAGNOSIS — I10 Essential (primary) hypertension: Secondary | ICD-10-CM

## 2015-07-19 DIAGNOSIS — F419 Anxiety disorder, unspecified: Secondary | ICD-10-CM

## 2015-07-19 MED ORDER — HYDROCHLOROTHIAZIDE 25 MG PO TABS: 25.0000 mg | ORAL_TABLET | Freq: Every day | ORAL | Status: DC

## 2015-07-19 NOTE — Progress Notes (Signed)
Patient ID: Gabriel Larson, male   DOB: April 26, 1975, 40 y.o.   MRN: [id number]    Subjective:  HPI  Hypertension, follow-up:  BP Readings from Last 3 Encounters:  07/19/15 152/88  05/10/15 148/96  03/09/15 162/84    He was last seen for hypertension 2 months ago.  BP at that visit was 148/96. Management since that visit includes none. He reports good compliance with treatment. He is not having side effects. He is not exercising in last couple of weeks because of traveling. He is adherent to low salt diet.   Outside blood pressures are running about 150's/90's. He is experiencing none.  Patient denies chest pain, chest pressure/discomfort, claudication, dyspnea, exertional chest pressure/discomfort, fatigue, irregular heart beat, lower extremity edema, near-syncope, orthopnea, palpitations, paroxysmal nocturnal dyspnea and syncope.    Wt Readings from Last 3 Encounters:  07/19/15 244 lb (110.678 kg)  05/10/15 233 lb (105.688 kg)  03/09/15 237 lb (107.502 kg)   Pt reports that he saw the nephrologist and told him that he may change some of his medications but wanted him to track his BPs and have blood work done and follow up.   ------------------------------------------------------------------------  Anxiety- 2 months ago we increased in Venlafaxine to 150 mg daily. He reports that it has helped and seems to be better. He is doing well on the increase.    Prior to Admission medications   Medication Sig Start Date End Date Taking? Authorizing Provider  amLODipine-olmesartan (AZOR) 5-40 MG tablet Take 1 tablet by mouth daily. 12/01/14  Yes Richard Maceo Pro., MD  aspirin EC 81 MG tablet Take 81 mg by mouth daily.   Yes Historical Provider, MD  metoprolol succinate (TOPROL-XL) 100 MG 24 hr tablet Take 1 tablet (100 mg total) by mouth daily. 03/23/15  Yes Richard Maceo Pro., MD  MULTIPLE VITAMIN PO Take 1 tablet by mouth daily.   Yes Historical Provider, MD  venlafaxine XR  (EFFEXOR-XR) 150 MG 24 hr capsule Take 1 capsule (150 mg total) by mouth daily. 05/10/15  Yes Richard Maceo Pro., MD    Patient Active Problem List   Diagnosis Date Noted  . 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 Adaptation reaction 05/25/2014  . Anxiety 05/25/2014  . Dips tobacco 05/25/2014  . 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 Dyslipidemia 05/25/2014  . Essential (primary) hypertension 05/25/2014  . Blood glucose elevated 05/25/2014  . Cannot sleep 05/25/2014  . Malaise and fatigue 05/25/2014  . Adiposity 05/25/2014  . Obstructive apnea 05/25/2014  . 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 No pertinent past medical history.  Social History   Social History  . Marital Status: Married    Spouse Name: N/A  . Number of Children: N/A  . Years of Education: N/A   Occupational History  . Not on file.   Social History Main Topics  . Smoking status: Never Smoker   . Smokeless tobacco: Current User    Types: Chew  . Alcohol Use: Yes  . Drug Use: No  . Sexual Activity: Not on file   Other Topics Concern  . Not on file   Social History Narrative    Allergies  Allergen Reactions  . Erythromycin     GI issues  . Metronidazole Other (See Comments)    GI issues  . Penicillins     Review of Systems  Constitutional: Negative.   HENT: Negative.   Eyes: Negative.   Respiratory: Negative.   Cardiovascular: Negative.   Gastrointestinal:  Negative.   Genitourinary: Negative.   Musculoskeletal: Negative.   Skin: Negative.   Neurological: Negative.   Endo/Heme/Allergies: Negative.   Psychiatric/Behavioral: Negative.     Immunization History  Administered Date(s) Administered  . DTaP 02/03/1974, 04/02/1974, 06/06/1974, 06/10/1975, 07/18/1979  . Hepatitis A 08/29/2005, 10/04/2005, 03/07/2006  . Hepatitis B 08/29/2005, 10/04/2005, 03/07/2006  . IPV 02/03/1974, 04/02/1974, 06/06/1974, 10/10/1975, 07/18/1979  . Influenza-Unspecified 11/10/2014  . MMR  11/03/1974  . Td 08/26/2002  . Tdap 01/17/2012  . Typhoid Inactivated 08/29/2005   Objective:  BP 152/88 mmHg  Pulse 76  Temp(Src) 97.8 F (36.6 C) (Oral)  Resp 18  Wt 244 lb (110.678 kg)  Physical Exam  Constitutional: He is oriented to person, place, and time and well-developed, well-nourished, and in no distress.  HENT:  Head: Normocephalic and atraumatic.  Right Ear: External ear normal.  Left Ear: External ear normal.  Nose: Nose normal.  Eyes: Conjunctivae and EOM are normal. Pupils are equal, round, and reactive to light.  Neck: Normal range of motion. Neck supple.  Cardiovascular: Normal rate, regular rhythm, normal heart sounds and intact distal pulses.   Pulmonary/Chest: Effort normal and breath sounds normal.  Musculoskeletal: Normal range of motion.  Neurological: He is alert and oriented to person, place, and time. He has normal reflexes. Gait normal. GCS score is 15.  Skin: Skin is warm and dry.  Psychiatric: Mood, memory, affect and judgment normal.    Lab Results  Component Value Date   WBC 7.1 05/21/2015   HGB 16.5 02/10/2015   HCT 45.9 05/21/2015   PLT 217 05/21/2015   GLUCOSE 107* 05/21/2015   CHOL 142 05/21/2015   TRIG 169* 05/21/2015   HDL 30* 05/21/2015   LDLCALC 78 05/21/2015   TSH 2.470 05/21/2015   PSA 1.1 05/09/2012   HGBA1C 5.7* 05/21/2015    CMP     Component Value Date/Time   NA 143 05/21/2015 0810   NA 137 02/10/2015 1223   K 4.3 05/21/2015 0810   CL 99 05/21/2015 0810   CO2 25 05/21/2015 0810   GLUCOSE 107* 05/21/2015 0810   GLUCOSE 155* 02/10/2015 1223   BUN 14 05/21/2015 0810   BUN 16 02/10/2015 1223   CREATININE 1.32* 05/21/2015 0810   CREATININE 1.0 02/18/2013   CALCIUM 8.9 05/21/2015 0810   PROT 6.9 05/21/2015 0810   ALBUMIN 4.8 05/21/2015 0810   AST 21 05/21/2015 0810   ALT 21 05/21/2015 0810   ALKPHOS 67 05/21/2015 0810   BILITOT 1.0 05/21/2015 0810   GFRNONAA 67 05/21/2015 0810   GFRAA 77 05/21/2015 0810     Assessment and Plan :  1. Essential (primary) hypertension Not to goal. Start HCTZ and follow up 2-3 months.   - hydrochlorothiazide (HYDRODIURIL) 25 MG tablet; Take 1 tablet (25 mg total) by mouth daily.  Dispense: 30 tablet; Refill: 12  2. Anxiety/anxiety attacks Improved with the increase of Venlafaxine.  Pt could definitely benefit from counseling.  Patient was seen and examined by Dr. Miguel Aschoff, and noted scribed by Webb Laws, Corry MD [facility] 07/19/2015 4:12 PM

## 2015-07-26 DIAGNOSIS — E785 Hyperlipidemia, unspecified: Secondary | ICD-10-CM | POA: Diagnosis not present

## 2015-07-26 DIAGNOSIS — I10 Essential (primary) hypertension: Secondary | ICD-10-CM | POA: Diagnosis not present

## 2015-07-26 DIAGNOSIS — N2889 Other specified disorders of kidney and ureter: Secondary | ICD-10-CM | POA: Diagnosis not present

## 2015-10-25 ENCOUNTER — Ambulatory Visit (INDEPENDENT_AMBULATORY_CARE_PROVIDER_SITE_OTHER): Payer: BLUE CROSS/BLUE SHIELD | Admitting: Family Medicine

## 2015-10-25 VITALS — BP 122/80 | HR 72 | Temp 98.8°F | Resp 16 | Wt 237.0 lb

## 2015-10-25 DIAGNOSIS — Z23 Encounter for immunization: Secondary | ICD-10-CM | POA: Diagnosis not present

## 2015-10-25 NOTE — Progress Notes (Signed)
Gabriel Larson  MRN: [id number] DOB: 22-Aug-1975  Subjective:  HPI   The patient is a 40 year old male who presents for follow up of his hypertension and anxiety.  He was last seen on 07/19/15 and his blood pressure at that time was 152/88.  He has since lost 7 pounds.  He has also been to see Dr. Hyman Bibleallaru and was instructed to increase his beta blocker.  He is now on Metoprolol 100 mg twice daily.  He states that he has noticed that he is much more tired since increasing it.  He states he is snoring much louder than before and it is much harder for him to get up in the morning.  He said that during his lunch instead of going out to eat he goes to his car and takes a nap.  He checks his blood pressure at home daily and states he has been getting reading that go along with our reading today.  He is also here to follow up on his anxiety.  The patient states that on his last visit here he was instructed to increase his Venlafaxine to 150 mg daily.  He states that he feels much better on this.  He said that he is now much more mellow.    Patient Active Problem List   Diagnosis Date Noted  . 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 Adaptation reaction 05/25/2014  . Anxiety 05/25/2014  . Dips tobacco 05/25/2014  . 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 Dyslipidemia 05/25/2014  . Essential (primary) hypertension 05/25/2014  . Blood glucose elevated 05/25/2014  . Cannot sleep 05/25/2014  . Malaise and fatigue 05/25/2014  . Adiposity 05/25/2014  . Obstructive apnea 05/25/2014  . 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  Social History   Social History  . Marital status: Married    Spouse name: N/A  . Number of children: N/A  . Years of education: N/A   Occupational History  . Not on file.   Social History Main Topics  . Smoking status: Never Smoker  . Smokeless tobacco: Current User    Types: Chew  . Alcohol use Yes  . Drug use: No  . Sexual activity:  Not on file   Other Topics Concern  . Not on file   Social History Narrative  . No narrative on file    Outpatient Encounter Prescriptions as of 10/25/2015  Medication Sig  . amLODipine-olmesartan (AZOR) 5-40 MG tablet Take 1 tablet by mouth daily.  Marland Kitchen. aspirin EC 81 MG tablet Take 81 mg by mouth daily.  . hydrochlorothiazide (HYDRODIURIL) 25 MG tablet Take 1 tablet (25 mg total) by mouth daily.  . metoprolol succinate (TOPROL-XL) 100 MG 24 hr tablet Take 1 tablet (100 mg total) by mouth daily. (Patient taking differently: Take 100 mg by mouth 2 (two) times daily. )  . MULTIPLE VITAMIN PO Take 1 tablet by mouth daily.  Marland Kitchen. venlafaxine XR (EFFEXOR-XR) 150 MG 24 hr capsule Take 1 capsule (150 mg total) by mouth daily.   No facility-administered encounter medications on file as of 10/25/2015.     Allergies  Allergen Reactions  . Erythromycin     GI issues  . Metronidazole Other (See Comments)    GI issues  . Penicillins     Review of Systems  Constitutional: Positive for malaise/fatigue (? secondary to the Beta blocker). Negative for fever.  Eyes: Negative.   Respiratory: Negative for cough, shortness of breath and wheezing.  Cardiovascular: Positive for leg swelling. Negative for chest pain and palpitations.  Gastrointestinal: Negative.   Musculoskeletal: Negative.   Skin: Negative.   Neurological: Negative for dizziness, weakness and headaches.  Endo/Heme/Allergies: Negative.   Psychiatric/Behavioral: Negative.    Objective:  BP 122/80 (BP Location: Right Arm, Patient Position: Sitting, Cuff Size: Normal)   Pulse 72   Temp 98.8 F (37.1 C) (Oral)   Resp 16   Wt 237 lb (107.5 kg)   BMI 34.01 kg/m   Physical Exam  Constitutional: He is oriented to person, place, and time and well-developed, well-nourished, and in no distress.  HENT:  Head: Normocephalic and atraumatic.  Right Ear: External ear normal.  Left Ear: External ear normal.  Nose: Nose normal.  Eyes:  Conjunctivae are normal. No scleral icterus.  Neck: Neck supple. No thyromegaly present.  Cardiovascular: Normal rate, regular rhythm and normal heart sounds.   Pulmonary/Chest: Effort normal and breath sounds normal.  Abdominal: Soft.  Lymphadenopathy:    He has no cervical adenopathy.  Neurological: He is alert and oriented to person, place, and time.  Skin: Skin is warm and dry.  Fair skin.  Psychiatric: Mood, memory, affect and judgment normal.    Assessment and Plan :  Need for immunization against influenza - Plan: Flu Vaccine QUAD 36+ mos PF IM (Fluarix & Fluzone Quad PF) 1. Need for immunization against influenza  - Flu Vaccine QUAD 36+ mos PF IM (Fluarix & Fluzone Quad PF) 2. Hypertension Improved control 3. Depression/anxiety Much improved on venlafaxine 4. Possible sleep apnea Consider sleep study 5. Mild obesity Work on diet and exercise. I have done the exam and reviewed the chart and it is accurate to the best of my knowledge. Julieanne Manson M.D. [facility]

## 2015-11-01 ENCOUNTER — Other Ambulatory Visit: Payer: Self-pay | Admitting: Nephrology

## 2015-11-01 DIAGNOSIS — N2889 Other specified disorders of kidney and ureter: Secondary | ICD-10-CM

## 2015-11-08 ENCOUNTER — Ambulatory Visit
Admission: RE | Admit: 2015-11-08 | Discharge: 2015-11-08 | Disposition: A | Discharge status: Home / Self Care | Payer: BLUE CROSS/BLUE SHIELD | Source: Ambulatory Visit | Attending: Nephrology | Admitting: Nephrology

## 2015-11-08 DIAGNOSIS — K5790 Diverticulosis of intestine, part unspecified, without perforation or abscess without bleeding: Secondary | ICD-10-CM | POA: Insufficient documentation

## 2015-11-08 DIAGNOSIS — N2889 Other specified disorders of kidney and ureter: Secondary | ICD-10-CM | POA: Diagnosis present

## 2015-11-08 DIAGNOSIS — I700 Atherosclerosis of aorta: Secondary | ICD-10-CM | POA: Insufficient documentation

## 2015-11-08 DIAGNOSIS — K760 Fatty (change of) liver, not elsewhere classified: Secondary | ICD-10-CM | POA: Insufficient documentation

## 2015-11-08 DIAGNOSIS — N281 Cyst of kidney, acquired: Secondary | ICD-10-CM | POA: Insufficient documentation

## 2015-11-08 HISTORY — DX: Essential (primary) hypertension: I10

## 2015-11-08 MED ORDER — IOPAMIDOL (ISOVUE-370) INJECTION 76%
100.0000 mL | Freq: Once | INTRAVENOUS | Status: AC | PRN
  Administered 2015-11-08: 100 mL via INTRAVENOUS

## 2015-12-04 ENCOUNTER — Other Ambulatory Visit: Payer: Self-pay | Admitting: Family Medicine

## 2016-04-03 ENCOUNTER — Other Ambulatory Visit: Payer: Self-pay

## 2016-04-03 DIAGNOSIS — I10 Essential (primary) hypertension: Secondary | ICD-10-CM

## 2016-04-03 MED ORDER — HYDROCHLOROTHIAZIDE 25 MG PO TABS: 25.0000 mg | ORAL_TABLET | Freq: Every day | ORAL | 3 refills | Status: DC

## 2016-04-03 MED ORDER — AMLODIPINE-OLMESARTAN 5-40 MG PO TABS: 1.0000 | ORAL_TABLET | Freq: Every day | ORAL | 3 refills | Status: DC

## 2016-04-03 MED ORDER — VENLAFAXINE HCL ER 150 MG PO CP24: 150.0000 mg | ORAL_CAPSULE | Freq: Every day | ORAL | 3 refills | Status: DC

## 2016-04-24 ENCOUNTER — Encounter: Payer: Self-pay | Admitting: Family Medicine

## 2016-04-24 ENCOUNTER — Ambulatory Visit (INDEPENDENT_AMBULATORY_CARE_PROVIDER_SITE_OTHER): Payer: BLUE CROSS/BLUE SHIELD | Admitting: Family Medicine

## 2016-04-24 VITALS — BP 130/84 | HR 81 | Temp 97.6°F | Resp 16 | Wt 241.0 lb

## 2016-04-24 DIAGNOSIS — F419 Anxiety disorder, unspecified: Secondary | ICD-10-CM | POA: Diagnosis not present

## 2016-04-24 DIAGNOSIS — L409 Psoriasis, unspecified: Secondary | ICD-10-CM | POA: Diagnosis not present

## 2016-04-24 DIAGNOSIS — I10 Essential (primary) hypertension: Secondary | ICD-10-CM

## 2016-04-24 MED ORDER — MOMETASONE FUROATE 0.1 % EX CREA: 1.0000 "application " | TOPICAL_CREAM | Freq: Every day | CUTANEOUS | 1 refills | Status: AC

## 2016-04-24 NOTE — Progress Notes (Signed)
Subjective:  HPI  Hypertension, follow-up:  BP Readings from Last 3 Encounters:  04/24/16 130/84  10/25/15 122/80  07/19/15 (!) 152/88    He was last seen for hypertension 6 months ago.  BP at that visit was 122/80 Management since that visit includes increased Metoprolol and added HCTZ. Marland Kitchen He reports good compliance with treatment. He is not having side effects.  He is exercising. He is adherent to low salt diet.   Outside blood pressures are running about like today's visit. He is experiencing fatigue. He reports that the beta blocker makes him feel "lazy" but it is a tolerable problem right now. He does not feel his medication needs to be adjusted at this time. Patient denies chest pain, chest pressure/discomfort, claudication, dyspnea, exertional chest pressure/discomfort, irregular heart beat, lower extremity edema, near-syncope, orthopnea, palpitations, paroxysmal nocturnal dyspnea, syncope and tachypnea.  Cardiovascular risk factors include advanced age (older than 59 for men, 28 for women), hypertension, male gender and obesity (BMI >= 30 kg/m2).   Wt Readings from Last 3 Encounters:  04/24/16 241 lb (109.3 kg)  10/25/15 237 lb (107.5 kg)  07/19/15 244 lb (110.7 kg)   ------------------------------------------------------------------------  Depression/Anxiety- Pt is taking the Venlafaxine and reports that he is doing well. He reports that every once in a while when he has a bad day he will take an additional pill, he wants to know if this is ok?  Prior to Admission medications   Medication Sig Start Date End Date Taking? Authorizing Provider  amLODipine-olmesartan (AZOR) 5-40 MG tablet Take 1 tablet by mouth daily. 04/03/16   Jerrol Banana., MD  aspirin EC 81 MG tablet Take 81 mg by mouth daily.    Historical Provider, MD  hydrochlorothiazide (HYDRODIURIL) 25 MG tablet Take 1 tablet (25 mg total) by mouth daily. 04/03/16   Abree Romick Maceo Pro., MD  metoprolol  succinate (TOPROL-XL) 100 MG 24 hr tablet Take 1 tablet (100 mg total) by mouth daily. Patient taking differently: Take 100 mg by mouth 2 (two) times daily.  03/23/15   Aryanah Enslow Maceo Pro., MD  MULTIPLE VITAMIN PO Take 1 tablet by mouth daily.    Historical Provider, MD  venlafaxine XR (EFFEXOR-XR) 150 MG 24 hr capsule Take 1 capsule (150 mg total) by mouth daily. 04/03/16   Jerrol Banana., MD    Patient Active Problem List   Diagnosis Date Noted  . 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 Adaptation reaction 05/25/2014  . Anxiety 05/25/2014  . Dips tobacco 05/25/2014  . 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 Dyslipidemia 05/25/2014  . Essential (primary) hypertension 05/25/2014  . Blood glucose elevated 05/25/2014  . Cannot sleep 05/25/2014  . Malaise and fatigue 05/25/2014  . Adiposity 05/25/2014  . Obstructive apnea 05/25/2014  . 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 Hypertension     Social History   Social History  . Marital status: Married    Spouse name: N/A  . Number of children: N/A  . Years of education: N/A   Occupational History  . Not on file.   Social History Main Topics  . Smoking status: Never Smoker  . Smokeless tobacco: Current User    Types: Chew  . Alcohol use Yes  . Drug use: No  . Sexual activity: Not on file   Other Topics Concern  . Not on file   Social History Narrative  . No narrative on file  Allergies  Allergen Reactions  . Erythromycin     GI issues  . Metronidazole Other (See Comments)    GI issues  . Penicillins     Review of Systems  Constitutional: Positive for malaise/fatigue.  HENT: Negative.   Eyes: Negative.   Respiratory: Negative.   Cardiovascular: Negative.   Gastrointestinal: Negative.   Genitourinary: Negative.   Musculoskeletal: Negative.   Skin: Positive for rash (left lower calf lesion that he thought was psorisis but has not gone away with his normal  treatment).  Endo/Heme/Allergies: Negative.   Psychiatric/Behavioral: Negative.     Immunization History  Administered Date(s) Administered  . DTaP 02/03/1974, 04/02/1974, 06/06/1974, 06/10/1975, 07/18/1979  . Hepatitis A 08/29/2005, 10/04/2005, 03/07/2006  . Hepatitis B 08/29/2005, 10/04/2005, 03/07/2006  . IPV 02/03/1974, 04/02/1974, 06/06/1974, 10/10/1975, 07/18/1979  . Influenza,inj,Quad PF,36+ Mos 10/25/2015  . Influenza-Unspecified 11/10/2014  . MMR 11/03/1974  . Td 08/26/2002  . Tdap 01/17/2012  . Typhoid Inactivated 08/29/2005    Objective:  BP 130/84 (BP Location: Left Arm, Patient Position: Sitting, Cuff Size: Large)   Pulse 81   Temp 97.6 F (36.4 C) (Oral)   Resp 16   Wt 241 lb (109.3 kg)   SpO2 94%   BMI 34.58 kg/m   Physical Exam  Constitutional: He is oriented to person, place, and time and well-developed, well-nourished, and in no distress.  HENT:  Head: Normocephalic and atraumatic.  Right Ear: External ear normal.  Left Ear: External ear normal.  Nose: Nose normal.  Eyes: Conjunctivae are normal. No scleral icterus.  Neck: Normal range of motion. Neck supple.  Cardiovascular: Normal rate, regular rhythm, normal heart sounds and intact distal pulses.   Pulmonary/Chest: Effort normal and breath sounds normal.  Abdominal: Soft.  Musculoskeletal: Normal range of motion.  Neurological: He is alert and oriented to person, place, and time. He has normal reflexes. Gait normal. GCS score is 15.  Skin: Skin is warm and dry.  1-2 inch psoriatic lesion on left upper calf.   Psychiatric: Mood, memory, affect and judgment normal.    Lab Results  Component Value Date   WBC 7.1 05/21/2015   HGB 16.5 02/10/2015   HCT 45.9 05/21/2015   PLT 217 05/21/2015   GLUCOSE 107 (H) 05/21/2015   CHOL 142 05/21/2015   TRIG 169 (H) 05/21/2015   HDL 30 (L) 05/21/2015   LDLCALC 78 05/21/2015   TSH 2.470 05/21/2015   PSA 1.1 05/09/2012   HGBA1C 5.7 (H) 05/21/2015     CMP     Component Value Date/Time   NA 143 05/21/2015 0810   K 4.3 05/21/2015 0810   CL 99 05/21/2015 0810   CO2 25 05/21/2015 0810   GLUCOSE 107 (H) 05/21/2015 0810   GLUCOSE 155 (H) 02/10/2015 1223   BUN 14 05/21/2015 0810   CREATININE 1.32 (H) 05/21/2015 0810   CALCIUM 8.9 05/21/2015 0810   PROT 6.9 05/21/2015 0810   ALBUMIN 4.8 05/21/2015 0810   AST 21 05/21/2015 0810   ALT 21 05/21/2015 0810   ALKPHOS 67 05/21/2015 0810   BILITOT 1.0 05/21/2015 0810   GFRNONAA 67 05/21/2015 0810   GFRAA 77 05/21/2015 0810    Assessment and Plan :  1. Essential (primary) hypertension Stable.   2. Anxiety Stable.   3. Psoriasis  - mometasone (ELOCON) 0.1 % cream; Apply 1 application topically daily. Only use 2 weeks at a time  Dispense: 45 g; Refill: 1   HPI, Exam, and A&P Transcribed under the direction and  in the presence of Eveleigh Crumpler L. Cranford Mon, MD  Electronically Signed: Katina Dung, CMA I have done the exam and reviewed the above chart and it is accurate to the best of my knowledge. Development worker, community has been used in this note in any air is in the dictation or transcription are unintentional.  [facility] 04/24/2016 4:21 PM

## 2016-06-05 IMAGING — CT CT HEAD W/O CM
1 series · 16 of 30 positions shown, 20 images · non-contrast
Comparison: None.

CLINICAL DATA: Sudden onset confusion this morning. Hypertension.
Lightheadedness.

EXAM:
CT HEAD WITHOUT CONTRAST
TECHNIQUE: Contiguous axial images were obtained from the base of the skull
through the vertex without intravenous contrast.

[Series 2: soft tissue · axial · 0.43mm/px · z∈[+386,+530]mm · 16 of 33 slices shown, 20 images]
[im 2/33  brain]
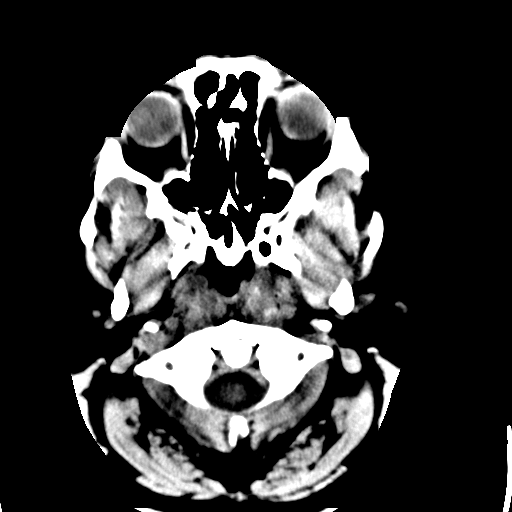
[im 2/33  bone]
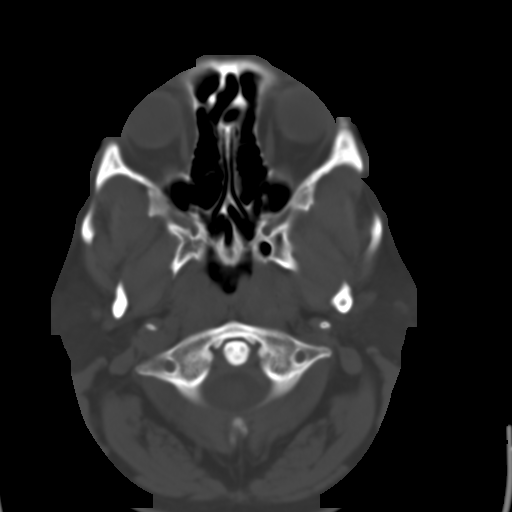
[im 4/33  brain]
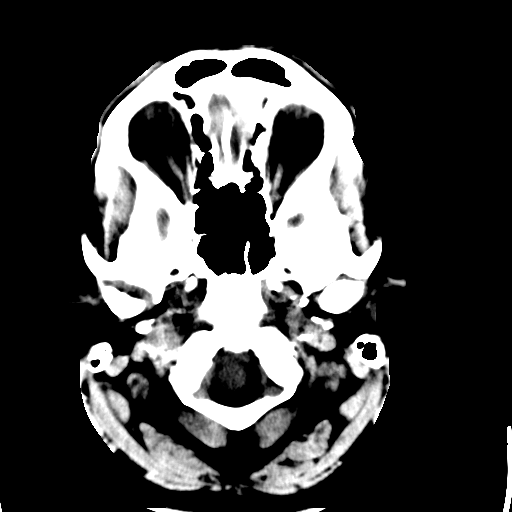
[im 6/33  brain]
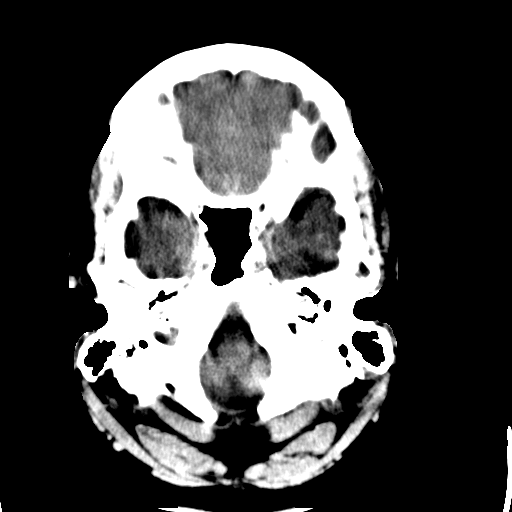
[im 8/33  brain]
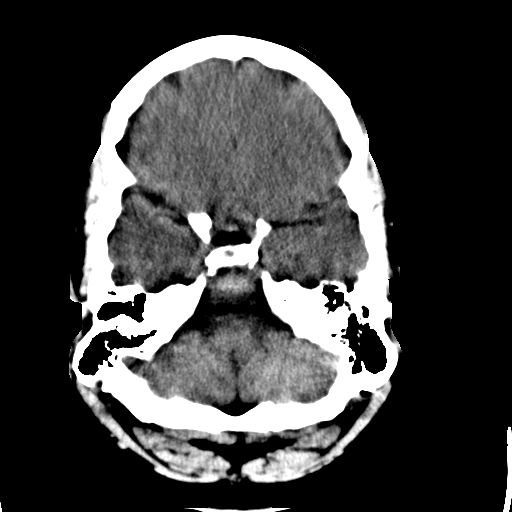
[im 9/33  brain]
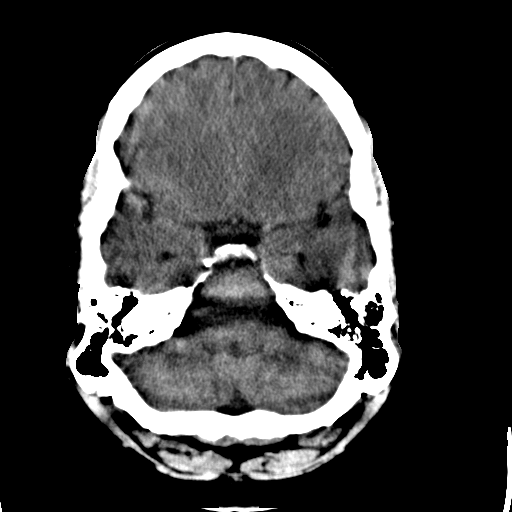
[im 9/33  bone]
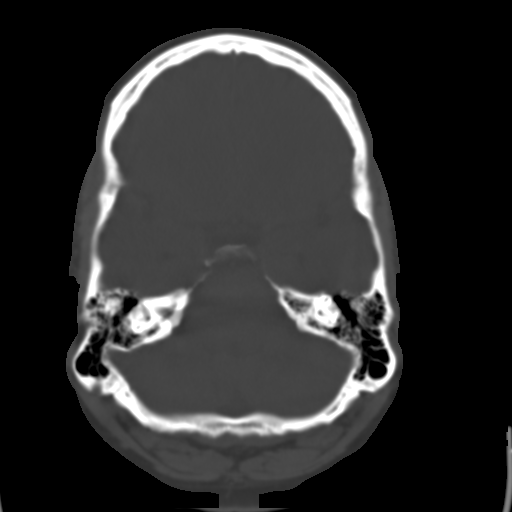
[im 12/33  brain]
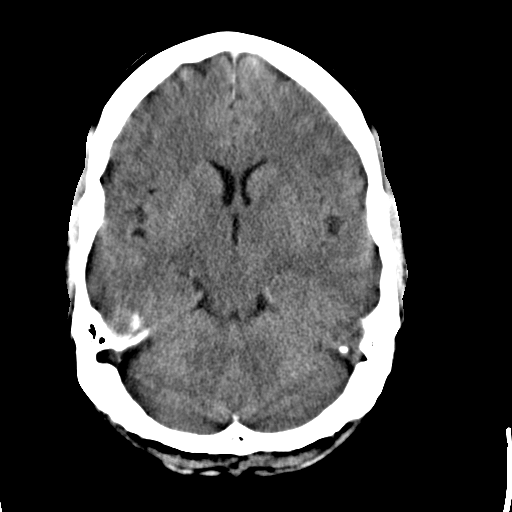
[im 14/33  brain]
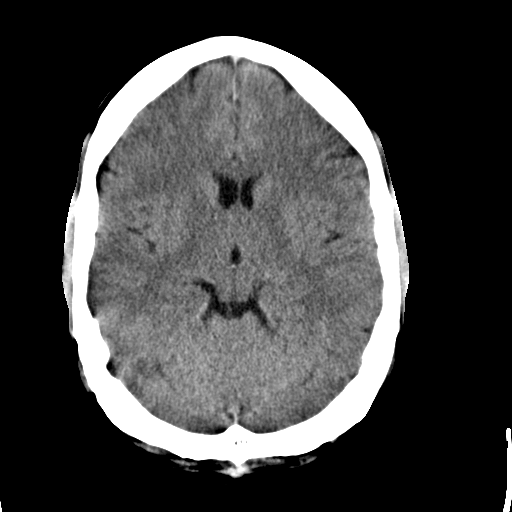
[im 16/33  brain]
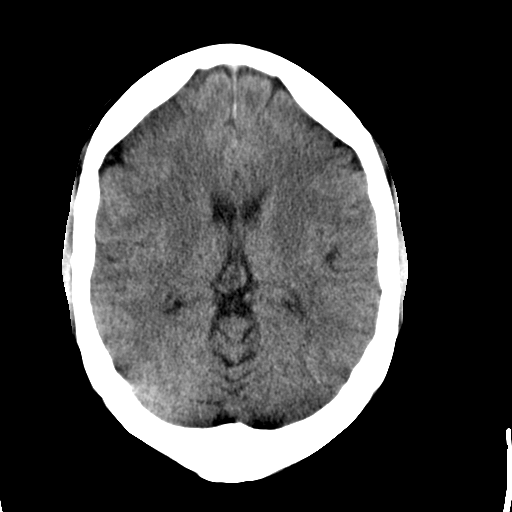
[im 17/33  brain]
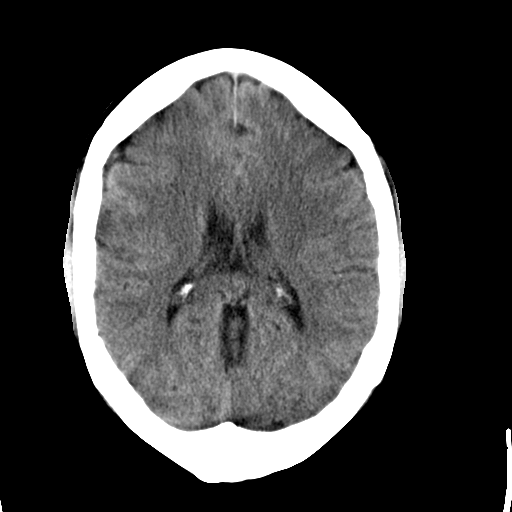
[im 17/33  bone]
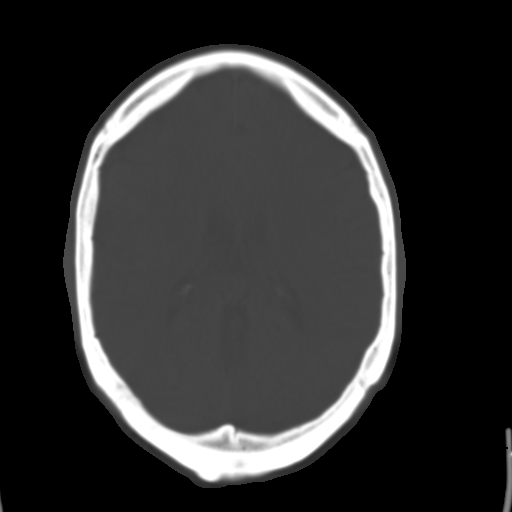
[im 19/33  brain]
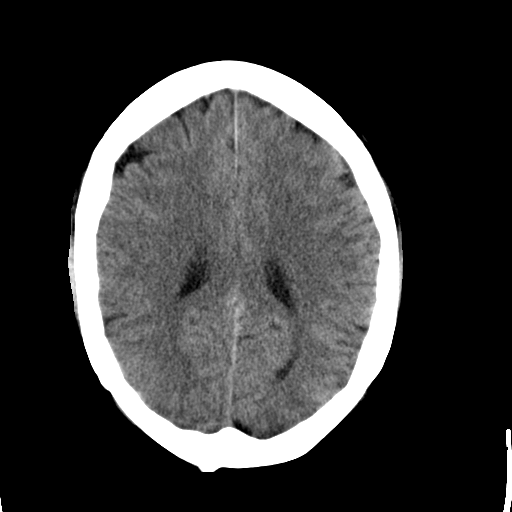
[im 21/33  brain]
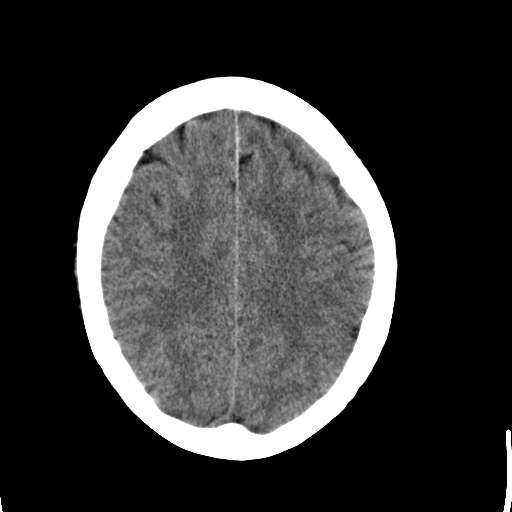
[im 24/33  brain]
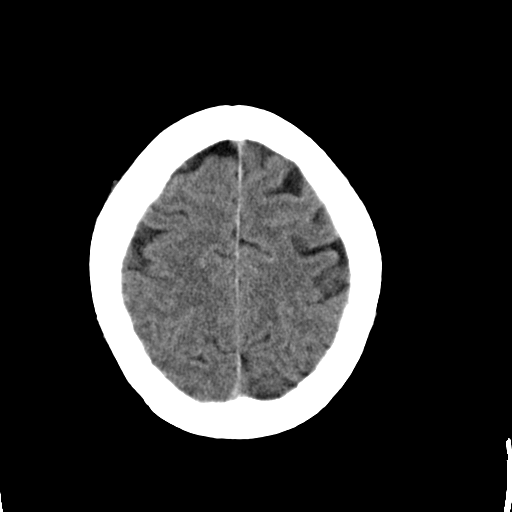
[im 25/33  brain]
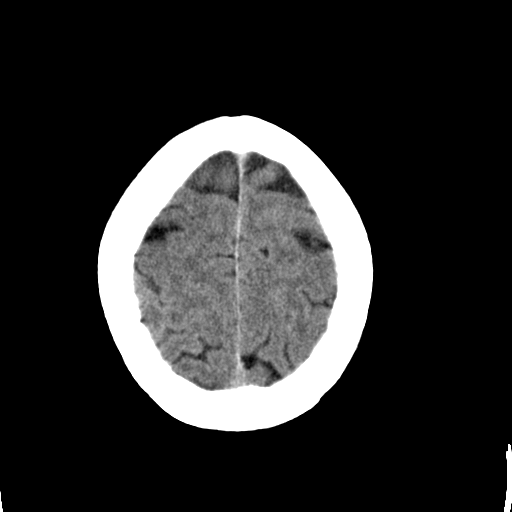
[im 25/33  bone]
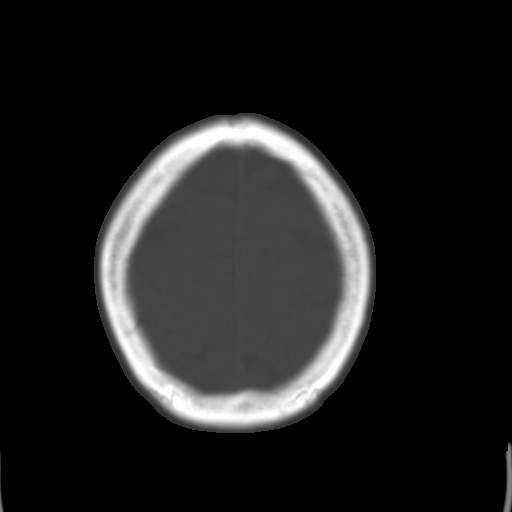
[im 27/33  brain]
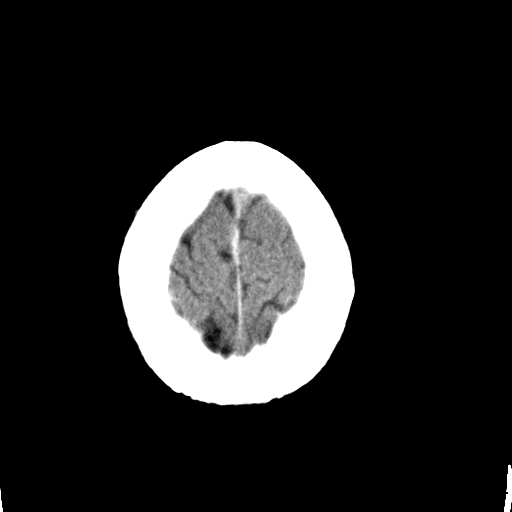
[im 29/33  brain]
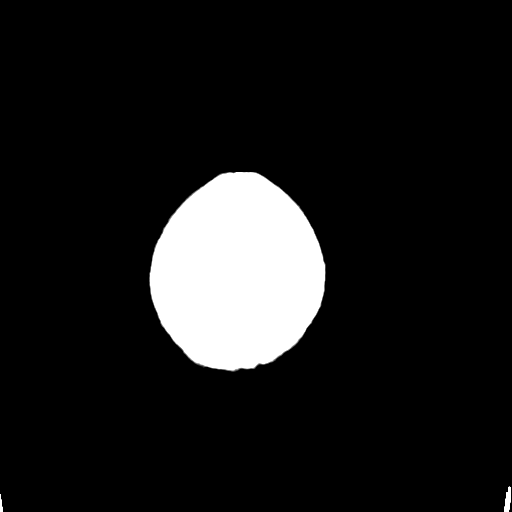
[im 31/33  brain]
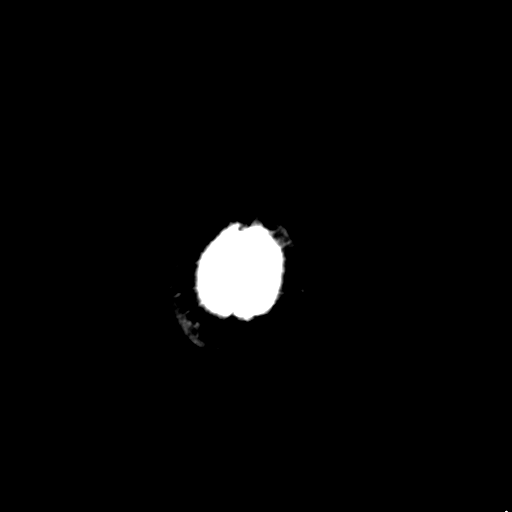

[16 of 30 positions shown; findings below may reference images not displayed]

FINDINGS: The brainstem, cerebellum, cerebral peduncles, thalami, basal
ganglia, basilar cisterns, and ventricular system appear within
normal limits. No intracranial hemorrhage, mass lesion, or acute
CVA.
IMPRESSION: 1. No significant intracranial abnormality observed.

## 2016-10-16 ENCOUNTER — Ambulatory Visit: Payer: BLUE CROSS/BLUE SHIELD | Admitting: Family Medicine

## 2016-10-30 ENCOUNTER — Telehealth: Payer: Self-pay | Admitting: Family Medicine

## 2016-10-30 DIAGNOSIS — I10 Essential (primary) hypertension: Secondary | ICD-10-CM

## 2016-10-30 MED ORDER — METOPROLOL SUCCINATE ER 100 MG PO TB24: 100.0000 mg | ORAL_TABLET | Freq: Every day | ORAL | 3 refills | Status: DC

## 2016-10-30 NOTE — Telephone Encounter (Signed)
Pt stated that [facility] advised him his insurance won't cover metoprolol succinate (TOPROL-XL) 100 MG 24 hr tablet and they need a new Rx for a covered medication. Pt stated that he is leaving the country in a few days and needs a new Rx sent to [facility] [address] to last until he gets back and then send to [facility]. Please advise. Thanks TNP

## 2016-10-30 NOTE — Telephone Encounter (Signed)
Spoke with patient to see if insurance provided list of medications that would be covered. Patient states they did not tell him. He is leaving out of country this Thursday for about 2 weeks and [facility] him till he comes back and he will call insurance to find out what exactly is the issue with metoprolol and what other medications would be covered if to switch. Patient thinks it is a higher tier maybe for this medication but he will; let us know.-Charm Stenner V Domnick Chervenak, RMA

## 2016-11-16 ENCOUNTER — Ambulatory Visit: Payer: BLUE CROSS/BLUE SHIELD | Admitting: Family Medicine

## 2016-11-16 ENCOUNTER — Other Ambulatory Visit: Payer: Self-pay

## 2016-11-16 VITALS — BP 120/76 | HR 70 | Temp 98.4°F | Resp 16 | Wt 239.0 lb

## 2016-11-16 DIAGNOSIS — I10 Essential (primary) hypertension: Secondary | ICD-10-CM

## 2016-11-16 DIAGNOSIS — F325 Major depressive disorder, single episode, in full remission: Secondary | ICD-10-CM

## 2016-11-16 MED ORDER — METOPROLOL SUCCINATE ER 100 MG PO TB24: 100.0000 mg | ORAL_TABLET | Freq: Every day | ORAL | 3 refills | Status: DC

## 2016-11-16 NOTE — Progress Notes (Signed)
Gabriel Larson  MRN: [id number] DOB: November 10, 1975  Subjective:  HPI   The patient is a 41 year old who presents for follow up of his chronic disease,  He was last seen in April.  Since that time there was some confusion about his Metoprolol.  He had been taking the Succinate 50 mg daily and was told to increase to 100 mg of the succinate daily.  Because he has so many at home he was taking 2 of the 50 mg pills.  When his bottle was refilled for the 100 mg it read to take 2 daily as if it were the 50 mg.  His insurance would not cover the medicine through the mail order but we have now gotten this straight.  He is on 100 mg 1 pill daily of the Succinate.   Patient Active Problem List   Diagnosis Date Noted  . 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 Adaptation reaction 05/25/2014  . Anxiety 05/25/2014  . Dips tobacco 05/25/2014  . 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 Dyslipidemia 05/25/2014  . Essential (primary) hypertension 05/25/2014  . Blood glucose elevated 05/25/2014  . Cannot sleep 05/25/2014  . Malaise and fatigue 05/25/2014  . Adiposity 05/25/2014  . Obstructive apnea 05/25/2014  . 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 Hypertension     Social History   Socioeconomic History  . Marital status: Married    Spouse name: Not on file  . Number of children: Not on file  . Years of education: Not on file  . Highest education level: Not on file  Social Needs  . Financial resource strain: Not on file  . Food insecurity - worry: Not on file  . Food insecurity - inability: Not on file  . Transportation needs - medical: Not on file  . Transportation needs - non-medical: Not on file  Occupational History  . Not on file  Tobacco Use  . Smoking status: Never Smoker  . Smokeless tobacco: Current User    Types: Chew  Substance and Sexual Activity  . Alcohol use: Yes  . Drug use: No  . Sexual activity: Not on file  Other Topics  Concern  . Not on file  Social History Narrative  . Not on file    Outpatient Encounter Medications as of 11/16/2016  Medication Sig  . amLODipine-olmesartan (AZOR) 5-40 MG tablet Take 1 tablet by mouth daily.  Marland Kitchen. aspirin EC 81 MG tablet Take 81 mg by mouth daily.  . hydrochlorothiazide (HYDRODIURIL) 25 MG tablet Take 1 tablet (25 mg total) by mouth daily.  . metoprolol succinate (TOPROL-XL) 100 MG 24 hr tablet Take 1 tablet (100 mg total) by mouth daily.  . mometasone (ELOCON) 0.1 % cream Apply 1 application topically daily. Only use 2 weeks at a time  . MULTIPLE VITAMIN PO Take 1 tablet by mouth daily.  Marland Kitchen. venlafaxine XR (EFFEXOR-XR) 150 MG 24 hr capsule Take 1 capsule (150 mg total) by mouth daily.   No facility-administered encounter medications on file as of 11/16/2016.     Allergies  Allergen Reactions  . Erythromycin     GI issues  . Metronidazole Other (See Comments)    GI issues  . Penicillins     Review of Systems  Constitutional: Negative for chills, fever and malaise/fatigue.  HENT: Positive for congestion, sinus pain and sore throat. Negative for ear discharge, ear pain, hearing loss, nosebleeds and tinnitus.   Eyes: Negative for blurred  vision, double vision, photophobia, pain, discharge and redness.  Respiratory: Positive for cough and sputum production (green). Negative for hemoptysis, shortness of breath and wheezing.   Cardiovascular: Negative for chest pain, palpitations, orthopnea, claudication and leg swelling.  Neurological: Negative for weakness.  Endo/Heme/Allergies: Negative.   Psychiatric/Behavioral: Negative.     Objective:  BP 120/76 (BP Location: Right Arm, Patient Position: Sitting, Cuff Size: Normal)   Pulse 70   Temp 98.4 F (36.9 C) (Oral)   Resp 16   Wt 239 lb (108.4 kg)   BMI 34.29 kg/m   Physical Exam  Constitutional: He is oriented to person, place, and time and well-developed, well-nourished, and in no distress.  HENT:  Head:  Normocephalic and atraumatic.  Eyes: Conjunctivae are normal. No scleral icterus.  Neck: No thyromegaly present.  Cardiovascular: Normal rate, regular rhythm and normal heart sounds.  Pulmonary/Chest: Effort normal and breath sounds normal.  Abdominal: Soft.  Neurological: He is alert and oriented to person, place, and time.  Skin: Skin is warm and dry.  Psychiatric: Mood, memory, affect and judgment normal.    Assessment and Plan :  1. Essential (primary) hypertension  - metoprolol succinate (TOPROL-XL) 100 MG 24 hr tablet; Take 1 tablet (100 mg total) daily by mouth.  Dispense: 90 tablet; Refill: 3 2.Depression Mild--in remission.  I have done the exam and reviewed the chart and it is accurate to the best of my knowledge. DentistDragon  technology has been used and  any errors in dictation or transcription are unintentional. Julieanne Mansonichard Gilbert M.D. [facility]

## 2017-02-21 ENCOUNTER — Other Ambulatory Visit: Payer: Self-pay

## 2017-02-21 ENCOUNTER — Ambulatory Visit (INDEPENDENT_AMBULATORY_CARE_PROVIDER_SITE_OTHER): Payer: BLUE CROSS/BLUE SHIELD | Admitting: Family Medicine

## 2017-02-21 VITALS — BP 140/90 | HR 76 | Temp 98.5°F | Resp 16 | Wt 240.0 lb

## 2017-02-21 DIAGNOSIS — Z0000 Encounter for general adult medical examination without abnormal findings: Secondary | ICD-10-CM

## 2017-02-21 DIAGNOSIS — Z125 Encounter for screening for malignant neoplasm of prostate: Secondary | ICD-10-CM

## 2017-02-21 LAB — POCT URINALYSIS DIPSTICK
BILIRUBIN UA: NEGATIVE
Glucose, UA: NEGATIVE
Ketones, UA: NEGATIVE
LEUKOCYTES UA: NEGATIVE
Nitrite, UA: NEGATIVE
PH UA: 6.5 (ref 5.0–8.0)
Spec Grav, UA: 1.015 (ref 1.010–1.025)
UROBILINOGEN UA: NEGATIVE U/dL — AB

## 2017-02-21 NOTE — Progress Notes (Signed)
Patient: Gabriel Larson, Male    DOB: 01/15/1975, 42 y.o.   MRN: [id number] Visit Date: 02/21/2017  Today's Provider: Wilhemena Durie, MD   Chief Complaint  Patient presents with  . Annual Exam   Subjective:  Gabriel Larson is a 41 y.o. male who presents today for health maintenance and complete physical. He feels well. He reports exercising twice. He reports he is sleeping well. He is married and has 2 children. He has to travel a lot for his work.  Immunization History  Administered Date(s) Administered  . DTaP 02/03/1974, 04/02/1974, 06/06/1974, 06/10/1975, 07/18/1979  . Hepatitis A 08/29/2005, 10/04/2005, 03/07/2006  . Hepatitis B 08/29/2005, 10/04/2005, 03/07/2006  . IPV 02/03/1974, 04/02/1974, 06/06/1974, 10/10/1975, 07/18/1979  . Influenza,inj,Quad PF,6+ Mos 10/25/2015  . Influenza-Unspecified 11/10/2014  . MMR 11/03/1974  . Td 08/26/2002  . Tdap 01/17/2012  . Typhoid Inactivated 08/29/2005   06/01/08 Colonoscopy, Wohl-Diverticulosis, erythematous mucosa  Review of Systems  Constitutional: Negative.   HENT: Negative.   Eyes: Negative.   Respiratory: Negative.   Cardiovascular: Negative.   Gastrointestinal: Negative.   Endocrine: Negative.   Genitourinary: Negative.   Musculoskeletal: Negative.   Skin: Negative.   Allergic/Immunologic: Negative.   Neurological: Negative.   Hematological: Negative.   Psychiatric/Behavioral: Negative.     Social History   Socioeconomic History  . Marital status: Married    Spouse name: Not on file  . Number of children: Not on file  . Years of education: Not on file  . Highest education level: Not on file  Social Needs  . Financial resource strain: Not on file  . Food insecurity - worry: Not on file  . Food insecurity - inability: Not on file  . Transportation needs - medical: Not on file  . Transportation needs - non-medical: Not on file  Occupational History  . Not on file  Tobacco Use  . Smoking status: Never  Smoker  . Smokeless tobacco: Current User    Types: Chew  Substance and Sexual Activity  . Alcohol use: Yes  . Drug use: No  . Sexual activity: Not on file  Other Topics Concern  . Not on file  Social History Narrative  . Not on file    Patient Active Problem List   Diagnosis Date Noted  . 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 Adaptation reaction 05/25/2014  . Anxiety 05/25/2014  . Dips tobacco 05/25/2014  . 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 Dyslipidemia 05/25/2014  . Essential (primary) hypertension 05/25/2014  . Blood glucose elevated 05/25/2014  . Cannot sleep 05/25/2014  . Malaise and fatigue 05/25/2014  . Adiposity 05/25/2014  . Obstructive apnea 05/25/2014  . 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 APPENDECTOMY    . TONSILLECTOMY      His family history includes Cancer in his mother; Diabetes in his father and mother; Heart disease in his father and mother; Hypertension in his father and mother; Kidney disease in his mother and sister; Ulcers in his father.     Outpatient Encounter Medications as of 02/21/2017  Medication Sig  . amLODipine-olmesartan (AZOR) 5-40 MG tablet Take 1 tablet by mouth daily.  Marland Kitchen aspirin EC 81 MG tablet Take 81 mg by mouth daily.  . hydrochlorothiazide (HYDRODIURIL) 25 MG tablet Take 1 tablet (25 mg total) by mouth daily.  . metoprolol succinate (TOPROL-XL) 100 MG 24 hr tablet Take 1 tablet (100 mg total) daily by mouth.  . mometasone (ELOCON) 0.1 %  cream Apply 1 application topically daily. Only use 2 weeks at a time  . MULTIPLE VITAMIN PO Take 1 tablet by mouth daily.  Marland Kitchen venlafaxine XR (EFFEXOR-XR) 150 MG 24 hr capsule Take 1 capsule (150 mg total) by mouth daily.   No facility-administered encounter medications on file as of 02/21/2017.     Patient Care Team: Jerrol Banana., MD as PCP - General (Family Medicine)      Objective:   Vitals:  Vitals:   02/21/17 1017  02/21/17 1102  BP: (!) 148/92 140/90  Pulse: 76   Resp: 16   Temp: 98.5 F (36.9 C)   TempSrc: Oral   Weight: 240 lb (108.9 kg)     Physical Exam  Constitutional: He is oriented to person, place, and time. He appears well-developed and well-nourished.  HENT:  Head: Normocephalic and atraumatic.  Right Ear: External ear normal.  Left Ear: External ear normal.  Nose: Nose normal.  Mouth/Throat: Oropharynx is clear and moist.  Eyes: Conjunctivae and EOM are normal. Pupils are equal, round, and reactive to light.  Neck: Normal range of motion. Neck supple.  Cardiovascular: Normal rate, regular rhythm, normal heart sounds and intact distal pulses.  Pulmonary/Chest: Effort normal and breath sounds normal.  Abdominal: Soft. Bowel sounds are normal.  Genitourinary: Rectum normal, prostate normal and penis normal.  Musculoskeletal: Normal range of motion.  Neurological: He is alert and oriented to person, place, and time.  Skin: Skin is warm and dry.  Borderline atypical nevi of trunk.  Psychiatric: He has a normal mood and affect. His behavior is normal. Judgment and thought content normal.   Fall Risk  02/21/2017  Falls in the past year? No   Depression Screen PHQ 2/9 Scores 02/21/2017 11/16/2016  PHQ - 2 Score 0 0  PHQ- 9 Score - 2   Functional Status Survey: Is the patient deaf or have difficulty hearing?: Yes Does the patient have difficulty seeing, even when wearing glasses/contacts?: No Does the patient have difficulty concentrating, remembering, or making decisions?: No Does the patient have difficulty walking or climbing stairs?: No Does the patient have difficulty dressing or bathing?: No Does the patient have difficulty doing errands alone such as visiting a doctor's office or shopping?: No  Current Exercise Habits: Home exercise routine, Frequency (Times/Week): 2     Assessment & Plan:     Routine Health Maintenance and Physical Exam  Exercise Activities and  Dietary recommendations Goals    None      Immunization History  Administered Date(s) Administered  . DTaP 02/03/1974, 04/02/1974, 06/06/1974, 06/10/1975, 07/18/1979  . Hepatitis A 08/29/2005, 10/04/2005, 03/07/2006  . Hepatitis B 08/29/2005, 10/04/2005, 03/07/2006  . IPV 02/03/1974, 04/02/1974, 06/06/1974, 10/10/1975, 07/18/1979  . Influenza,inj,Quad PF,6+ Mos 10/25/2015  . Influenza-Unspecified 11/10/2014  . MMR 11/03/1974  . Td 08/26/2002  . Tdap 01/17/2012  . Typhoid Inactivated 08/29/2005    Health Maintenance  Topic Date Due  . HIV Screening  11/06/1988  . INFLUENZA VACCINE  08/09/2016  . TETANUS/TDAP  01/16/2022    Colonoscopy 2023. Discussed health benefits of physical activity, and encouraged him to engage in regular exercise appropriate for his age and condition.  Obesity Diet and exercise discussed. Atypical Nevi Per Dr Evorn Gong.  I have done the exam and reviewed the chart and it is accurate to the best of my knowledge. Development worker, community has been used and  any errors in dictation or transcription are unintentional. Miguel Aschoff M.D. [facility]

## 2017-02-22 LAB — CBC WITH DIFFERENTIAL/PLATELET
Basophils Absolute: 0 10*3/uL (ref 0.0–0.2)
Basos: 0 %
EOS (ABSOLUTE): 0.1 10*3/uL (ref 0.0–0.4)
EOS: 1 %
HEMATOCRIT: 49.1 % (ref 37.5–51.0)
Hemoglobin: 16.8 g/dL (ref 13.0–17.7)
IMMATURE GRANULOCYTES: 0 %
Immature Grans (Abs): 0 10*3/uL (ref 0.0–0.1)
LYMPHS ABS: 1.9 10*3/uL (ref 0.7–3.1)
Lymphs: 23 %
MCH: 31.1 pg (ref 26.6–33.0)
MCHC: 34.2 g/dL (ref 31.5–35.7)
MCV: 91 fL (ref 79–97)
Monocytes Absolute: 0.5 10*3/uL (ref 0.1–0.9)
Monocytes: 6 %
NEUTROS PCT: 70 %
Neutrophils Absolute: 5.8 10*3/uL (ref 1.4–7.0)
PLATELETS: 248 10*3/uL (ref 150–379)
RBC: 5.4 x10E6/uL (ref 4.14–5.80)
RDW: 14.4 % (ref 12.3–15.4)
WBC: 8.3 10*3/uL (ref 3.4–10.8)

## 2017-02-22 LAB — COMPREHENSIVE METABOLIC PANEL
A/G RATIO: 1.9 (ref 1.2–2.2)
ALK PHOS: 60 IU/L (ref 39–117)
ALT: 30 IU/L (ref 0–44)
AST: 20 IU/L (ref 0–40)
Albumin: 5.1 g/dL (ref 3.5–5.5)
BUN / CREAT RATIO: 13 (ref 9–20)
BUN: 16 mg/dL (ref 6–24)
Bilirubin Total: 0.9 mg/dL (ref 0.0–1.2)
CHLORIDE: 97 mmol/L (ref 96–106)
CO2: 22 mmol/L (ref 20–29)
CREATININE: 1.24 mg/dL (ref 0.76–1.27)
Calcium: 9.6 mg/dL (ref 8.7–10.2)
GFR, EST AFRICAN AMERICAN: 82 mL/min/{1.73_m2} (ref >59)
GFR, EST NON AFRICAN AMERICAN: 71 mL/min/{1.73_m2} (ref >59)
GLOBULIN, TOTAL: 2.7 g/dL (ref 1.5–4.5)
Glucose: 110 mg/dL — ABNORMAL HIGH (ref 65–99)
Potassium: 3.8 mmol/L (ref 3.5–5.2)
Sodium: 142 mmol/L (ref 134–144)
Total Protein: 7.8 g/dL (ref 6.0–8.5)

## 2017-02-22 LAB — LIPID PANEL WITH LDL/HDL RATIO
Cholesterol, Total: 175 mg/dL (ref 100–199)
HDL: 33 mg/dL — ABNORMAL LOW (ref >39)
LDL CALC: 67 mg/dL (ref 0–99)
LDL/HDL RATIO: 2 ratio (ref 0.0–3.6)
Triglycerides: 375 mg/dL — ABNORMAL HIGH (ref 0–149)
VLDL Cholesterol Cal: 75 mg/dL — ABNORMAL HIGH (ref 5–40)

## 2017-02-22 LAB — PSA: Prostate Specific Ag, Serum: 1.2 ng/mL (ref 0.0–4.0)

## 2017-02-22 LAB — TSH: TSH: 3.16 u[IU]/mL (ref 0.450–4.500)

## 2017-03-15 ENCOUNTER — Other Ambulatory Visit: Payer: Self-pay | Admitting: Family Medicine

## 2017-04-12 ENCOUNTER — Other Ambulatory Visit: Payer: Self-pay

## 2017-04-12 MED ORDER — AMLODIPINE-OLMESARTAN 5-40 MG PO TABS: 1.0000 | ORAL_TABLET | Freq: Every day | ORAL | 3 refills | Status: DC

## 2017-04-12 NOTE — Telephone Encounter (Signed)
Patient is requesting a refill on amLODipine-olmesartan (AZOR) 5-40 MG tablet [facility]. CB# [phone]

## 2017-04-12 NOTE — Telephone Encounter (Signed)
Sent!

## 2017-05-16 ENCOUNTER — Other Ambulatory Visit: Payer: Self-pay | Admitting: Family Medicine

## 2017-05-16 DIAGNOSIS — I10 Essential (primary) hypertension: Secondary | ICD-10-CM

## 2017-06-20 ENCOUNTER — Ambulatory Visit: Payer: Self-pay | Admitting: Family Medicine

## 2017-07-09 ENCOUNTER — Other Ambulatory Visit: Payer: Self-pay | Admitting: Family Medicine

## 2017-07-13 ENCOUNTER — Other Ambulatory Visit: Payer: Self-pay

## 2017-07-13 NOTE — Telephone Encounter (Signed)
Pt rescheduled FU for 07/18/2017. He needs a refill of amdlodipine-olmesartan sent to [facility]. Has 5 tablets left.

## 2017-07-16 MED ORDER — AMLODIPINE-OLMESARTAN 5-40 MG PO TABS: 1.0000 | ORAL_TABLET | Freq: Every day | ORAL | 3 refills | Status: DC

## 2017-07-18 ENCOUNTER — Ambulatory Visit: Payer: BLUE CROSS/BLUE SHIELD | Admitting: Family Medicine

## 2017-07-18 ENCOUNTER — Encounter: Payer: Self-pay | Admitting: Family Medicine

## 2017-07-18 VITALS — BP 142/80 | HR 82 | Temp 98.5°F | Resp 16 | Ht 70.0 in | Wt 240.0 lb

## 2017-07-18 DIAGNOSIS — G4733 Obstructive sleep apnea (adult) (pediatric): Secondary | ICD-10-CM

## 2017-07-18 DIAGNOSIS — I10 Essential (primary) hypertension: Secondary | ICD-10-CM | POA: Diagnosis not present

## 2017-07-18 DIAGNOSIS — F4323 Adjustment disorder with mixed anxiety and depressed mood: Secondary | ICD-10-CM

## 2017-07-18 NOTE — Progress Notes (Signed)
Patient: Gabriel Larson Male    DOB: Dec 06, 1975   42 y.o.   MRN: [id number] Visit Date: 07/18/2017  Today's Provider: Megan Mans, MD   Chief Complaint  Patient presents with  . Hypertension   Subjective:    HPI  Hypertension, follow-up:  BP Readings from Last 3 Encounters:  07/18/17 (!) 142/80  02/21/17 140/90  11/16/16 120/76    He was last seen for hypertension 5 months ago.  BP at that visit was 140/90. Management since that visit includes no changes. He reports good compliance with treatment. He is not having side effects.  He is not exercising. He is adherent to low salt diet.   Outside blood pressures are not being checked. He is experiencing none.  Patient denies exertional chest pressure/discomfort, lower extremity edema and palpitations.    Cardiovascular risk factors include obesity (BMI >= 30 kg/m2).  Weight trend: stable Wt Readings from Last 3 Encounters:  07/18/17 240 lb (108.9 kg)  02/21/17 240 lb (108.9 kg)  11/16/16 239 lb (108.4 kg)    Current diet: well balanced     Allergies  Allergen Reactions  . Erythromycin     GI issues  . Metronidazole Other (See Comments)    GI issues  . Penicillins      Current Outpatient Medications:  .  amLODipine-olmesartan (AZOR) 5-40 MG tablet, TAKE 1 TABLET DAILY (KEEP FOLLOW UP APPOINTMENT), Disp: 90 tablet, Rfl: 3 .  aspirin EC 81 MG tablet, Take 81 mg by mouth daily., Disp: , Rfl:  .  hydrochlorothiazide (HYDRODIURIL) 25 MG tablet, TAKE 1 TABLET DAILY (KEEP FOLLOW UP APPOINTMENT), Disp: 90 tablet, Rfl: 3 .  metoprolol succinate (TOPROL-XL) 100 MG 24 hr tablet, Take 1 tablet (100 mg total) daily by mouth., Disp: 90 tablet, Rfl: 3 .  MULTIPLE VITAMIN PO, Take 1 tablet by mouth daily., Disp: , Rfl:  .  venlafaxine XR (EFFEXOR-XR) 150 MG 24 hr capsule, TAKE 1 CAPSULE DAILY (KEEP FOLLOW UP APPOINTMENT), Disp: 90 capsule, Rfl: 3 .  amLODipine-olmesartan (AZOR) 5-40 MG tablet, Take 1 tablet  by mouth daily., Disp: 90 tablet, Rfl: 3 .  mometasone (ELOCON) 0.1 % cream, Apply 1 application topically daily. Only use 2 weeks at a time, Disp: 45 g, Rfl: 1  Review of Systems  Constitutional: Negative for activity change, appetite change, chills, diaphoresis, fatigue, fever and unexpected weight change.  HENT: Negative.   Eyes: Negative.   Respiratory: Negative for cough and shortness of breath.   Cardiovascular: Negative for chest pain, palpitations and leg swelling.  Gastrointestinal: Negative.   Endocrine: Negative.   Musculoskeletal: Negative for arthralgias.  Skin: Negative.   Allergic/Immunologic: Negative for environmental allergies.  Neurological: Negative for dizziness and headaches.  Psychiatric/Behavioral: Negative.     Social History   Tobacco Use  . Smoking status: Never Smoker  . Smokeless tobacco: Current User    Types: Chew  Substance Use Topics  . Alcohol use: Yes   Objective:   BP (!) 142/80 (BP Location: Right Arm, Patient Position: Sitting, Cuff Size: Large)   Pulse 82   Temp 98.5 F (36.9 C)   Resp 16   Ht 5\' 10"  (1.778 m)   Wt 240 lb (108.9 kg)   SpO2 98%   BMI 34.44 kg/m  Vitals:   07/18/17 1353  BP: (!) 142/80  Pulse: 82  Resp: 16  Temp: 98.5 F (36.9 C)  SpO2: 98%  Weight: 240 lb (108.9 kg)  Height:  5\' 10"  (1.778 m)     Physical Exam  Constitutional: He is oriented to person, place, and time. He appears well-developed and well-nourished.  HENT:  Head: Normocephalic and atraumatic.  Right Ear: External ear normal.  Left Ear: External ear normal.  Nose: Nose normal.  Eyes: Conjunctivae are normal. No scleral icterus.  Neck: No thyromegaly present.  Cardiovascular: Normal rate, regular rhythm and normal heart sounds.  Pulmonary/Chest: Effort normal and breath sounds normal.  Abdominal: Soft.  Musculoskeletal: He exhibits no edema.  Neurological: He is alert and oriented to person, place, and time.  Skin: Skin is warm and dry.   Psychiatric: He has a normal mood and affect. His behavior is normal. Judgment and thought content normal.        Assessment & Plan:    HTN Depression/GAD All stable. No changes.     I have done the exam and reviewed the above chart and it is accurate to the best of my knowledge. DentistDragon  technology has been used in this note in any air is in the dictation or transcription are unintentional.  Megan Mansichard [name], MD  [facility]

## 2017-10-19 ENCOUNTER — Other Ambulatory Visit: Payer: Self-pay | Admitting: Family Medicine

## 2017-10-19 DIAGNOSIS — I10 Essential (primary) hypertension: Secondary | ICD-10-CM

## 2018-02-13 ENCOUNTER — Other Ambulatory Visit: Payer: Self-pay

## 2018-02-13 ENCOUNTER — Encounter: Payer: Self-pay | Admitting: Family Medicine

## 2018-04-03 ENCOUNTER — Encounter: Payer: Self-pay | Admitting: Family Medicine

## 2018-04-15 ENCOUNTER — Other Ambulatory Visit: Payer: Self-pay | Admitting: Family Medicine

## 2018-07-02 ENCOUNTER — Other Ambulatory Visit: Payer: Self-pay | Admitting: Family Medicine

## 2018-07-02 DIAGNOSIS — I10 Essential (primary) hypertension: Secondary | ICD-10-CM

## 2018-07-21 ENCOUNTER — Other Ambulatory Visit: Payer: Self-pay | Admitting: Family Medicine

## 2019-01-15 ENCOUNTER — Other Ambulatory Visit: Payer: Self-pay | Admitting: Family Medicine

## 2019-01-15 DIAGNOSIS — I10 Essential (primary) hypertension: Secondary | ICD-10-CM

## 2019-01-15 NOTE — Telephone Encounter (Signed)
Requested medication (s) are due for refill today: yes  Requested medication (s) are on the active medication list:  yes  Last refill:  10/17/2018  Future visit scheduled: no  Notes to clinic:  no valid encounter within last 6 months   Requested Prescriptions  Pending Prescriptions Disp Refills   metoprolol succinate (TOPROL-XL) 100 MG 24 hr tablet [Pharmacy Med Name: METOPROLOL SUCCINATE ER TABS 100MG ] 90 tablet 3    Sig: TAKE 1 TABLET DAILY      Cardiovascular:  Beta Blockers Failed - 01/15/2019 12:27 AM      Failed - Last BP in normal range    BP Readings from Last 1 Encounters:  07/18/17 (!) 142/80          Failed - Valid encounter within last 6 months    Recent Outpatient Visits           1 year ago Essential (primary) hypertension   [facility] 09/18/17., MD   1 year ago Annual physical exam   [facility]   2 years ago Major depressive disorder with single episode, in full remission [facility])   [facility] MD   2 years ago Essential (primary) hypertension   [facility] MD   3 years ago Need for immunization against influenza   [facility] MD              Passed - Last Heart Rate in normal range    Pulse Readings from Last 1 Encounters:  07/18/17 82

## 2019-03-27 ENCOUNTER — Ambulatory Visit: Payer: BC Managed Care – PPO | Attending: Internal Medicine

## 2019-03-27 DIAGNOSIS — Z23 Encounter for immunization: Secondary | ICD-10-CM

## 2019-03-27 NOTE — Progress Notes (Signed)
   Covid-19 Vaccination Clinic  Name:  Gabriel Larson    MRN: [id number] DOB: 05/25/1975  03/27/2019  [name] was observed post Covid-19 immunization for 15 minutes without incident. He was provided with Vaccine Information Sheet and instruction to access the V-Safe system.   [name] was instructed to call 911 with any severe reactions post vaccine: Marland Kitchen Difficulty breathing  . Swelling of face and throat  . A fast heartbeat  . A bad rash all over body  . Dizziness and weakness   Immunizations Administered    Name Date Dose VIS Date Route   Pfizer COVID-19 Vaccine 03/27/2019  2:08 PM 0.3 mL 12/20/2018 Intramuscular   Manufacturer: [employer], [employer]   Lot: [id number]   NDC: [address]-2

## 2019-04-07 ENCOUNTER — Other Ambulatory Visit: Payer: Self-pay | Admitting: Family Medicine

## 2019-04-07 DIAGNOSIS — I10 Essential (primary) hypertension: Secondary | ICD-10-CM

## 2019-04-08 ENCOUNTER — Other Ambulatory Visit: Payer: Self-pay | Admitting: Family Medicine

## 2019-04-08 DIAGNOSIS — I10 Essential (primary) hypertension: Secondary | ICD-10-CM

## 2019-04-08 MED ORDER — METOPROLOL SUCCINATE ER 100 MG PO TB24: 100.0000 mg | ORAL_TABLET | Freq: Every day | ORAL | 0 refills | Status: DC

## 2019-04-08 NOTE — Telephone Encounter (Signed)
metoprolol succinate (TOPROL-XL) 100 MG 24 hr tablet     Patient is requesting a partial refill until upcoming CPE.    Pharmacy: [facility] - [place], [place] - [address] Phone:  [phone]  Fax:  [phone]

## 2019-04-08 NOTE — Telephone Encounter (Signed)
Filled until upcoming appt. Requested Prescriptions  Pending Prescriptions Disp Refills  . metoprolol succinate (TOPROL-XL) 100 MG 24 hr tablet 30 tablet 0    Sig: Take 1 tablet (100 mg total) by mouth daily. Take with or immediately following a meal.     Cardiovascular:  Beta Blockers Failed - 04/08/2019  2:47 PM      Failed - Last BP in normal range    BP Readings from Last 1 Encounters:  07/18/17 (!) 142/80         Failed - Valid encounter within last 6 months    Recent Outpatient Visits          1 year ago Essential (primary) hypertension   [facility] MD   2 years ago Annual physical exam   [facility] MD   2 years ago Major depressive disorder with single episode, in full remission [facility])   [facility] MD   2 years ago Essential (primary) hypertension   [facility]   3 years ago Need for immunization against influenza   [facility] MD             Passed - Last Heart Rate in normal range    Pulse Readings from Last 1 Encounters:  07/18/17 82

## 2019-04-21 ENCOUNTER — Ambulatory Visit: Payer: BC Managed Care – PPO | Attending: Internal Medicine

## 2019-04-21 DIAGNOSIS — Z23 Encounter for immunization: Secondary | ICD-10-CM

## 2019-04-21 NOTE — Progress Notes (Signed)
   Covid-19 Vaccination Clinic  Name:  JAZIEL BENNETT    MRN: [id number] DOB: [birth year]  04/21/2019  Mr. Ferrell was observed post Covid-19 immunization for 15 minutes without incident. He was provided with Vaccine Information Sheet and instruction to access the V-Safe system.   Mr. Strohm was instructed to call 911 with any severe reactions post vaccine: Marland Kitchen Difficulty breathing  . Swelling of face and throat  . A fast heartbeat  . A bad rash all over body  . Dizziness and weakness   Immunizations Administered    Name Date Dose VIS Date Route   Pfizer COVID-19 Vaccine 04/21/2019 12:54 PM 0.3 mL 12/20/2018 Intramuscular   Manufacturer: [employer], [employer]   Lot: [id number]   NDC: [address]-9

## 2019-04-22 ENCOUNTER — Other Ambulatory Visit: Payer: Self-pay | Admitting: Family Medicine

## 2019-04-22 NOTE — Telephone Encounter (Signed)
Please review and advise if okay to approve 30day? Patient does have a upcoming appointment scheduled on 05/06/19 but wasn't sure if it was appropriate to fill since last visit wasn't till 2019. KW

## 2019-04-22 NOTE — Telephone Encounter (Signed)
Requested medication (s) are due for refill today: yes  Requested medication (s) are on the active medication list: yes  Last refill: 1//31/2021  Future visit scheduled: yes  Notes to clinic:  Patient has upcoming appointment  Review for short supply   Requested Prescriptions  Pending Prescriptions Disp Refills   venlafaxine XR (EFFEXOR-XR) 150 MG 24 hr capsule [Pharmacy Med Name: VENLAFAXINE HCL ER CAPS 150MG ] 90 capsule 3    Sig: TAKE 1 CAPSULE DAILY (KEEP FOLLOW UP APPOINTMENT)      Psychiatry: Antidepressants - SNRI - desvenlafaxine & venlafaxine Failed - 04/22/2019  7:44 AM      Failed - LDL in normal range and within 360 days    LDL Calculated  Date Value Ref Range Status  02/21/2017 67 0 - 99 mg/dL Final          Failed - Total Cholesterol in normal range and within 360 days    Cholesterol, Total  Date Value Ref Range Status  02/21/2017 175 100 - 199 mg/dL Final          Failed - Triglycerides in normal range and within 360 days    Triglycerides  Date Value Ref Range Status  02/21/2017 375 (H) 0 - 149 mg/dL Final          Failed - Completed PHQ-2 or PHQ-9 in the last 360 days.      Failed - Last BP in normal range    BP Readings from Last 1 Encounters:  07/18/17 (!) 142/80          Failed - Valid encounter within last 6 months    Recent Outpatient Visits           1 year ago Essential (primary) hypertension   [facility] [place]   2 years ago Annual physical exam   [facility] [place]   2 years ago Major depressive disorder with single episode, in full remission [facility])   [facility] [place]   2 years ago Essential (primary) hypertension   [facility] [place]   3 years ago Need for immunization against influenza   [facility] [place]

## 2019-04-22 NOTE — Telephone Encounter (Signed)
LOV 07/2017

## 2019-05-02 NOTE — Progress Notes (Signed)
Complete physical exam   Patient: Gabriel Larson   DOB: 12-Aug-1975   44 y.o. Male  MRN: [id number] Visit Date: 05/06/2019  Today's healthcare provider: Wilhemena Durie, MD   Chief Complaint  Patient presents with  . Annual Exam   Subjective    Gabriel Larson is a 44 y.o. male who presents today for a complete physical exam.  He reports consuming a low fat diet. Home exercise routine includes bike riding 2 miles. He generally feels fairly well. He reports sleeping poorly. He does not have additional problems to discuss today.  His wife and 2 adopted sons are doing well.  He has been working remotely during the pandemic and enjoys it actually.  He usually has to travel all over the world HPI  No complaints.  He states he is not snoring presently.   Last colonoscopy: 06/01/2008  Past Medical History:  Diagnosis Date  . Hypertension    Past Surgical History:  Procedure Laterality Date  . APPENDECTOMY    . TONSILLECTOMY     Social History   Socioeconomic History  . Marital status: Married    Spouse name: Not on file  . Number of children: Not on file  . Years of education: Not on file  . Highest education level: Not on file  Occupational History  . Not on file  Tobacco Use  . Smoking status: Never Smoker  . Smokeless tobacco: Current User    Types: Chew  Substance and Sexual Activity  . Alcohol use: Yes  . Drug use: No  . Sexual activity: Not on file  Other Topics Concern  . Not on file  Social History Narrative  . Not on file   Social Determinants of Health   Financial Resource Strain:   . Difficulty of Paying Living Expenses:   Food Insecurity:   . Worried About Charity fundraiser in the Last Year:   . Arboriculturist in the Last Year:   Transportation Needs:   . Film/video editor (Medical):   Marland Kitchen Lack of Transportation (Non-Medical):   Physical Activity:   . Days of Exercise per Week:   . Minutes of Exercise per Session:   Stress:   .  Feeling of Stress :   Social Connections:   . Frequency of Communication with Friends and Family:   . Frequency of Social Gatherings with Friends and Family:   . Attends Religious Services:   . Active Member of Clubs or Organizations:   . Attends Archivist Meetings:   Marland Kitchen Marital Status:   Intimate Partner Violence:   . Fear of Current or Ex-Partner:   . Emotionally Abused:   Marland Kitchen Physically Abused:   . Sexually Abused:    Family Status  Relation Name Status  . Father  Deceased  . Sister  Deceased       Died from respiratory failure of unknown cause  . Mother  (Not Specified)   Family History  Problem Relation Age of Onset  . Heart disease Father        Died from MI  . Hypertension Father   . Diabetes Father   . Ulcers Father   . Kidney disease Sister   . Hypertension Mother   . Diabetes Mother   . Heart disease Mother        CAD  . Cancer Mother        Bone and Multiple Myeloma  . Kidney disease Mother  Allergies  Allergen Reactions  . Erythromycin     GI issues  . Metronidazole Other (See Comments)    GI issues  . Penicillins     Patient Care Team: Jerrol Banana., MD as PCP - General (Family Medicine)   Medications: Outpatient Medications Prior to Visit  Medication Sig  . amLODipine-olmesartan (AZOR) 5-40 MG tablet TAKE 1 TABLET DAILY (KEEP FOLLOW UP APPOINTMENT )  . aspirin EC 81 MG tablet Take 81 mg by mouth daily.  . hydrochlorothiazide (HYDRODIURIL) 25 MG tablet TAKE 1 TABLET DAILY (KEEP FOLLOW UP APPOINTMENT)  . metoprolol succinate (TOPROL-XL) 100 MG 24 hr tablet Take 1 tablet (100 mg total) by mouth daily. Take with or immediately following a meal.  . mometasone (ELOCON) 0.1 % cream Apply 1 application topically daily. Only use 2 weeks at a time  . MULTIPLE VITAMIN PO Take 1 tablet by mouth daily.  Marland Kitchen venlafaxine XR (EFFEXOR-XR) 150 MG 24 hr capsule TAKE 1 CAPSULE DAILY (KEEP FOLLOW UP APPOINTMENT)   No facility-administered  medications prior to visit.    Review of Systems  Constitutional: Negative.   HENT: Negative.   Eyes: Negative.   Respiratory: Negative.   Cardiovascular: Negative.   Gastrointestinal: Negative.   Endocrine: Negative.   Musculoskeletal: Negative.   Skin: Negative.   Allergic/Immunologic: Negative.   Neurological: Negative.   Hematological: Negative.   Psychiatric/Behavioral: Negative.        Objective    BP 129/87 (BP Location: Right Arm, Patient Position: Sitting, Cuff Size: Large)   Pulse 74   Temp (!) 96.6 F (35.9 C) (Temporal)   Ht '5\' 10"'$  (1.778 m)   Wt 239 lb 6.4 oz (108.6 kg)   SpO2 99%   BMI 34.35 kg/m  BP Readings from Last 3 Encounters:  05/06/19 129/87  07/18/17 (!) 142/80  02/21/17 140/90   Wt Readings from Last 3 Encounters:  05/06/19 239 lb 6.4 oz (108.6 kg)  07/18/17 240 lb (108.9 kg)  02/21/17 240 lb (108.9 kg)      Physical Exam Vitals reviewed.  Constitutional:      Appearance: He is well-developed.  HENT:     Head: Normocephalic and atraumatic.     Right Ear: Tympanic membrane and external ear normal.     Left Ear: Tympanic membrane and external ear normal.     Nose: Nose normal.     Mouth/Throat:     Mouth: Mucous membranes are moist.     Pharynx: Oropharynx is clear.  Eyes:     General: No scleral icterus.    Conjunctiva/sclera: Conjunctivae normal.  Neck:     Thyroid: No thyromegaly.     Vascular: No carotid bruit.  Cardiovascular:     Rate and Rhythm: Normal rate and regular rhythm.     Heart sounds: Normal heart sounds.  Pulmonary:     Effort: Pulmonary effort is normal.     Breath sounds: Normal breath sounds.  Abdominal:     Palpations: Abdomen is soft.  Genitourinary:    Penis: Normal.      Testes: Normal.     Prostate: Normal.     Rectum: Normal.  Musculoskeletal:     Right lower leg: No edema.     Left lower leg: No edema.  Lymphadenopathy:     Cervical: No cervical adenopathy.  Skin:    General: Skin is warm  and dry.  Neurological:     General: No focal deficit present.     Mental Status:  He is alert and oriented to person, place, and time.  Psychiatric:        Mood and Affect: Mood normal.        Behavior: Behavior normal.        Thought Content: Thought content normal.        Judgment: Judgment normal.       Depression Screen  PHQ 2/9 Scores 02/21/2017 11/16/2016  PHQ - 2 Score 0 0  PHQ- 9 Score - 2    No results found for any visits on 05/06/19.  Assessment & Plan    Routine Health Maintenance and Physical Exam  Exercise Activities and Dietary recommendations discussed at length. Goals   None     Immunization History  Administered Date(s) Administered  . DTaP 02/03/1974, 04/02/1974, 06/06/1974, 06/10/1975, 07/18/1979  . Hepatitis A 08/29/2005, 10/04/2005, 03/07/2006  . Hepatitis B 08/29/2005, 10/04/2005, 03/07/2006  . IPV 02/03/1974, 04/02/1974, 06/06/1974, 10/10/1975, 07/18/1979  . Influenza,inj,Quad PF,6+ Mos 10/25/2015  . Influenza-Unspecified 11/10/2014  . MMR 11/03/1974  . PFIZER SARS-COV-2 Vaccination 03/27/2019, 04/21/2019  . Td 08/26/2002  . Tdap 01/17/2012  . Typhoid Inactivated 08/29/2005    Health Maintenance  Topic Date Due  . HIV Screening  Never done  . INFLUENZA VACCINE  08/10/2019  . TETANUS/TDAP  01/16/2022  . COVID-19 Vaccine  Completed    Discussed health benefits of physical activity, and encouraged him to engage in regular exercise appropriate for his age and condition.  1. Annual physical exam  - CBC with Differential/Platelet - TSH - Comprehensive metabolic panel - Lipid panel - POCT urinalysis dipstick  2. Essential (primary) hypertension  - CBC with Differential/Platelet - TSH - Comprehensive metabolic panel - Lipid panel - POCT urinalysis dipstick  3. Dyslipidemia  - CBC with Differential/Platelet - TSH - Comprehensive metabolic panel - Lipid panel - POCT urinalysis dipstick  4. Screening for prostate cancer  -  CBC with Differential/Platelet - TSH - Comprehensive metabolic panel - Lipid panel - PSA - POCT urinalysis dipstick  5. Encounter for screening fecal occult blood testing  - CBC with Differential/Platelet - TSH - Comprehensive metabolic panel - Lipid panel - POCT urinalysis dipstick - IFOBT POC (occult bld, rslt in office); Future - IFOBT POC (occult bld, rslt in office)   No follow-ups on file.     I, Wilhemena Durie, MD, have reviewed all documentation for this visit. The documentation on 05/09/19 for the exam, diagnosis, procedures, and orders are all accurate and complete.     Cranford Mon, MD  [facility] [phone] (phone) [phone] (fax)  [place]

## 2019-05-06 ENCOUNTER — Other Ambulatory Visit: Payer: Self-pay

## 2019-05-06 ENCOUNTER — Ambulatory Visit (INDEPENDENT_AMBULATORY_CARE_PROVIDER_SITE_OTHER): Payer: BC Managed Care – PPO | Admitting: Family Medicine

## 2019-05-06 ENCOUNTER — Encounter: Payer: Self-pay | Admitting: Family Medicine

## 2019-05-06 VITALS — BP 129/87 | HR 74 | Temp 96.6°F | Ht 70.0 in | Wt 239.4 lb

## 2019-05-06 DIAGNOSIS — Z125 Encounter for screening for malignant neoplasm of prostate: Secondary | ICD-10-CM

## 2019-05-06 DIAGNOSIS — E785 Hyperlipidemia, unspecified: Secondary | ICD-10-CM | POA: Diagnosis not present

## 2019-05-06 DIAGNOSIS — I10 Essential (primary) hypertension: Secondary | ICD-10-CM | POA: Diagnosis not present

## 2019-05-06 DIAGNOSIS — Z0000 Encounter for general adult medical examination without abnormal findings: Secondary | ICD-10-CM

## 2019-05-06 DIAGNOSIS — Z1211 Encounter for screening for malignant neoplasm of colon: Secondary | ICD-10-CM | POA: Diagnosis not present

## 2019-05-06 LAB — IFOBT (OCCULT BLOOD): IFOBT: NEGATIVE

## 2019-05-06 LAB — POCT URINALYSIS DIPSTICK
Bilirubin, UA: NEGATIVE
Blood, UA: NEGATIVE
Glucose, UA: NEGATIVE
Ketones, UA: NEGATIVE
Leukocytes, UA: NEGATIVE
Nitrite, UA: NEGATIVE
Protein, UA: NEGATIVE
Spec Grav, UA: >=1.03 — AB (ref 1.010–1.025)
Urobilinogen, UA: 0.2 E.U./dL
pH, UA: 6 (ref 5.0–8.0)

## 2019-05-30 ENCOUNTER — Other Ambulatory Visit: Payer: Self-pay | Admitting: Family Medicine

## 2019-05-30 DIAGNOSIS — I10 Essential (primary) hypertension: Secondary | ICD-10-CM

## 2019-07-23 ENCOUNTER — Other Ambulatory Visit: Payer: Self-pay | Admitting: Family Medicine

## 2019-07-23 DIAGNOSIS — I10 Essential (primary) hypertension: Secondary | ICD-10-CM

## 2019-07-23 NOTE — Telephone Encounter (Signed)
Requested medication (s) are due for refill today:  Yes  Requested medication (s) are on the active medication list:  yes  Future visit scheduled:  yes  Last Refill: HCTZ 07/02/18; #90/ RF x 3                   Amlodipine/ Olmesartan 07/21/18; #90/ RF x 3                   Venlafaxine 04/22/19 #90/ RF 0  Notes to Clinic: pt. Had annual physical on 05/06/19, but did not get labs done as was recommended.  Last labs 02/2017.  Please advise.   Requested Prescriptions  Pending Prescriptions Disp Refills   hydrochlorothiazide (HYDRODIURIL) 25 MG tablet [Pharmacy Med Name: HYDROCHLOROTHIAZIDE TABS 25MG ] 90 tablet 3    Sig: TAKE 1 TABLET DAILY (KEEP FOLLOW UP APPOINTMENT)      Cardiovascular: Diuretics - Thiazide Failed - 07/23/2019  8:58 AM      Failed - Ca in normal range and within 360 days    Calcium  Date Value Ref Range Status  02/21/2017 9.6 8.7 - 10.2 mg/dL Final          Failed - Cr in normal range and within 360 days    Creatinine, Ser  Date Value Ref Range Status  02/21/2017 1.24 0.76 - 1.27 mg/dL Final          Failed - K in normal range and within 360 days    Potassium  Date Value Ref Range Status  02/21/2017 3.8 3.5 - 5.2 mmol/L Final          Failed - Na in normal range and within 360 days    Sodium  Date Value Ref Range Status  02/21/2017 142 134 - 144 mmol/L Final          Passed - Last BP in normal range    BP Readings from Last 1 Encounters:  05/06/19 129/87          Passed - Valid encounter within last 6 months    Recent Outpatient Visits           2 months ago Annual physical exam   [facility] [place]   2 years ago Essential (primary) hypertension   [facility] [place]   2 years ago Annual physical exam   [facility] [place]   2 years ago Major depressive disorder with single episode, in full remission [facility])   [facility] [place]   3 years ago Essential (primary) hypertension   [facility] [place]       Future Appointments             In 9 months Maple Hudson., MD [facility]              amLODipine-olmesartan (AZOR) 5-40 MG tablet [Pharmacy Med Name: AMLODIPINE/OLMESARTAN TABS 5/40MG ] 90 tablet 3    Sig: TAKE 1 TABLET DAILY (KEEP FOLLOW UP APPOINTMENT )      Cardiovascular: CCB + ARB Combos Failed - 07/23/2019  8:58 AM      Failed - K in normal range and within 180 days    Potassium  Date Value Ref Range Status  02/21/2017 3.8 3.5 - 5.2 mmol/L Final          Failed - Cr in normal  range and within 180 days    Creatinine, Ser  Date Value Ref Range Status  02/21/2017 1.24 0.76 - 1.27 mg/dL Final          Passed - Patient is not pregnant      Passed - Last BP in normal range    BP Readings from Last 1 Encounters:  05/06/19 129/87          Passed - Valid encounter within last 6 months    Recent Outpatient Visits           2 months ago Annual physical exam   [facility] Maple Hudson., MD   2 years ago Essential (primary) hypertension   [facility] Maple Hudson., MD   2 years ago Annual physical exam   [facility] Maple Hudson., MD   2 years ago Major depressive disorder with single episode, in full remission [facility])   [facility] Maple Hudson., MD   3 years ago Essential (primary) hypertension   [facility] Maple Hudson., MD       Future Appointments             In 9 months Maple Hudson., MD [facility]              venlafaxine XR (EFFEXOR-XR) 150 MG 24 hr capsule [Pharmacy Med Name: VENLAFAXINE HCL ER CAPS 150MG ] 90 capsule 3    Sig: TAKE 1 CAPSULE DAILY (KEEP FOLLOW UP APPOINTMENT, NOT SEEN SINCE JULY 2019)      Psychiatry: Antidepressants - SNRI -  desvenlafaxine & venlafaxine Failed - 07/23/2019  8:58 AM      Failed - LDL in normal range and within 360 days    LDL Calculated  Date Value Ref Range Status  02/21/2017 67 0 - 99 mg/dL Final          Failed - Total Cholesterol in normal range and within 360 days    Cholesterol, Total  Date Value Ref Range Status  02/21/2017 175 100 - 199 mg/dL Final          Failed - Triglycerides in normal range and within 360 days    Triglycerides  Date Value Ref Range Status  02/21/2017 375 (H) 0 - 149 mg/dL Final          Passed - Completed PHQ-2 or PHQ-9 in the last 360 days.      Passed - Last BP in normal range    BP Readings from Last 1 Encounters:  05/06/19 129/87          Passed - Valid encounter within last 6 months    Recent Outpatient Visits           2 months ago Annual physical exam   [facility] [place]   2 years ago Essential (primary) hypertension   [facility] [place]   2 years ago Annual physical exam   [facility] MD   2 years ago Major depressive disorder with single episode, in full remission [facility])   [facility] [place]   3 years ago Essential (primary) hypertension   [facility] MD       Future Appointments             In 9 months Maple Hudson  Arma Heading., MD [facility]

## 2019-12-09 ENCOUNTER — Ambulatory Visit: Payer: BC Managed Care – PPO | Admitting: Family Medicine

## 2019-12-09 ENCOUNTER — Other Ambulatory Visit: Payer: Self-pay

## 2019-12-09 ENCOUNTER — Encounter: Payer: Self-pay | Admitting: Family Medicine

## 2019-12-09 VITALS — BP 137/88 | HR 92 | Temp 98.7°F | Wt 253.0 lb

## 2019-12-09 DIAGNOSIS — M1000 Idiopathic gout, unspecified site: Secondary | ICD-10-CM | POA: Diagnosis not present

## 2019-12-09 NOTE — Progress Notes (Signed)
Acute Office Visit  Subjective:    Patient ID: Gabriel Larson, male    DOB: 01/18/1975, 44 y.o.   MRN: [id number]  No chief complaint on file.   HPI Patient is in today for evaluation of possible gout.  He states his symptoms began on 11/27/19.Marland Kitchen  He at that time had pain, redness and swelling in the left great toe.  He reports that the pain is almost gone but the swelling is still very bad.  Past Medical History:  Diagnosis Date  . Hypertension     Past Surgical History:  Procedure Laterality Date  . APPENDECTOMY    . TONSILLECTOMY      Family History  Problem Relation Age of Onset  . Heart disease Father        Died from MI  . Hypertension Father   . Diabetes Father   . Ulcers Father   . Kidney disease Sister   . Hypertension Mother   . Diabetes Mother   . Heart disease Mother        CAD  . Cancer Mother        Bone and Multiple Myeloma  . Kidney disease Mother     Social History   Socioeconomic History  . Marital status: Married    Spouse name: Not on file  . Number of children: Not on file  . Years of education: Not on file  . Highest education level: Not on file  Occupational History  . Not on file  Tobacco Use  . Smoking status: Never Smoker  . Smokeless tobacco: Current User    Types: Chew  Substance and Sexual Activity  . Alcohol use: Yes  . Drug use: No  . Sexual activity: Not on file  Other Topics Concern  . Not on file  Social History Narrative  . Not on file   Social Determinants of Health   Financial Resource Strain:   . Difficulty of Paying Living Expenses: Not on file  Food Insecurity:   . Worried About Charity fundraiser in the Last Year: Not on file  . Ran Out of Food in the Last Year: Not on file  Transportation Needs:   . Lack of Transportation (Medical): Not on file  . Lack of Transportation (Non-Medical): Not on file  Physical Activity:   . Days of Exercise per Week: Not on file  . Minutes of Exercise per Session:  Not on file  Stress:   . Feeling of Stress : Not on file  Social Connections:   . Frequency of Communication with Friends and Family: Not on file  . Frequency of Social Gatherings with Friends and Family: Not on file  . Attends Religious Services: Not on file  . Active Member of Clubs or Organizations: Not on file  . Attends Archivist Meetings: Not on file  . Marital Status: Not on file  Intimate Partner Violence:   . Fear of Current or Ex-Partner: Not on file  . Emotionally Abused: Not on file  . Physically Abused: Not on file  . Sexually Abused: Not on file    Outpatient Medications Prior to Visit  Medication Sig Dispense Refill  . amLODipine-olmesartan (AZOR) 5-40 MG tablet TAKE 1 TABLET DAILY (KEEP FOLLOW UP APPOINTMENT ) 90 tablet 3  . aspirin EC 81 MG tablet Take 81 mg by mouth daily.    . hydrochlorothiazide (HYDRODIURIL) 25 MG tablet TAKE 1 TABLET DAILY (KEEP FOLLOW UP APPOINTMENT) 90 tablet 3  .  metoprolol succinate (TOPROL-XL) 100 MG 24 hr tablet TAKE 1 TABLET DAILY WITH OR IMMEDIATELY FOLLOWING A MEAL 30 tablet 11  . mometasone (ELOCON) 0.1 % cream Apply 1 application topically daily. Only use 2 weeks at a time 45 g 1  . MULTIPLE VITAMIN PO Take 1 tablet by mouth daily.    Marland Kitchen venlafaxine XR (EFFEXOR-XR) 150 MG 24 hr capsule TAKE 1 CAPSULE DAILY (KEEP FOLLOW UP APPOINTMENT, NOT SEEN SINCE JULY 2019) 90 capsule 3   No facility-administered medications prior to visit.    Allergies  Allergen Reactions  . Erythromycin     GI issues  . Metronidazole Other (See Comments)    GI issues  . Penicillins     Review of Systems  Musculoskeletal: Positive for arthralgias, gait problem and joint swelling.       Objective:     Today's Vitals   12/09/19 1517  BP: 137/88  Pulse: 92  Temp: 98.7 F (37.1 C)  TempSrc: Oral  SpO2: 99%  Weight: 253 lb (114.8 kg)   Body mass index is 36.3 kg/m.  Physical Exam Constitutional:      General: He is not in acute  distress.    Appearance: He is well-developed.  HENT:     Head: Normocephalic and atraumatic.     Right Ear: Hearing normal.     Left Ear: Hearing normal.     Nose: Nose normal.  Eyes:     General: Lids are normal. No scleral icterus.       Right eye: No discharge.        Left eye: No discharge.     Conjunctiva/sclera: Conjunctivae normal.  Pulmonary:     Effort: Pulmonary effort is normal. No respiratory distress.  Musculoskeletal:        General: Swelling and tenderness present. Normal range of motion.     Comments: Redness of the left 1st MTP joint with swelling from left calf to the foot. Some tenderness and warmth to palpation.  Skin:    Findings: No lesion or rash.  Neurological:     Mental Status: He is alert and oriented to person, place, and time.  Psychiatric:        Speech: Speech normal.        Behavior: Behavior normal.        Thought Content: Thought content normal.     There were no vitals taken for this visit. Wt Readings from Last 3 Encounters:  05/06/19 239 lb 6.4 oz (108.6 kg)  07/18/17 240 lb (108.9 kg)  02/21/17 240 lb (108.9 kg)    Health Maintenance Due  Topic Date Due  . Hepatitis C Screening  Never done  . HIV Screening  Never done  . INFLUENZA VACCINE  08/10/2019    There are no preventive care reminders to display for this patient.   Lab Results  Component Value Date   TSH 3.160 02/21/2017   Lab Results  Component Value Date   WBC 8.3 02/21/2017   HGB 16.8 02/21/2017   HCT 49.1 02/21/2017   MCV 91 02/21/2017   PLT 248 02/21/2017   Lab Results  Component Value Date   NA 142 02/21/2017   K 3.8 02/21/2017   CO2 22 02/21/2017   GLUCOSE 110 (H) 02/21/2017   BUN 16 02/21/2017   CREATININE 1.24 02/21/2017   BILITOT 0.9 02/21/2017   ALKPHOS 60 02/21/2017   AST 20 02/21/2017   ALT 30 02/21/2017   PROT 7.8 02/21/2017   ALBUMIN  5.1 02/21/2017   CALCIUM 9.6 02/21/2017   ANIONGAP 7 02/10/2015   Lab Results  Component Value Date    CHOL 175 02/21/2017   Lab Results  Component Value Date   HDL 33 (L) 02/21/2017   Lab Results  Component Value Date   LDLCALC 67 02/21/2017   Lab Results  Component Value Date   TRIG 375 (H) 02/21/2017   No results found for: [facility] Lab Results  Component Value Date   HGBA1C 5.7 (H) 05/21/2015       Assessment & Plan:   1. Acute idiopathic gout, unspecified site Onset of pain and swelling of the left 1st MTP and ankle since 11-27-19. Used cherry juice and Advil with improvement of discomfort and swelling. With history of dropping a roll of wire on the same joint today, will schedule labs and x-ray evaluation. May use Aleve prn inflammation. Given Low Purine Diet plan. Recheck pending lab reports. - CBC with Differential/Platelet - Sedimentation rate - Uric acid - DG Foot Complete Left    No orders of the defined types were placed in this encounter.    Juluis Mire, CMA

## 2019-12-09 NOTE — Patient Instructions (Signed)
Low-Purine Eating Plan A low-purine eating plan involves making food choices to limit your intake of purine. Purine is a kind of uric acid. Too much uric acid in your blood can cause certain conditions, such as gout and kidney stones. Eating a low-purine diet can help control these conditions. What are tips for following this plan? Reading food labels   Avoid foods with saturated or Trans fat.  Check the ingredient list of grains-based foods, such as bread and cereal, to make sure that they contain whole grains.  Check the ingredient list of sauces or soups to make sure they do not contain meat or fish.  When choosing soft drinks, check the ingredient list to make sure they do not contain high-fructose corn syrup. Shopping  Buy plenty of fresh fruits and vegetables.  Avoid buying canned or fresh fish.  Buy dairy products labeled as low-fat or nonfat.  Avoid buying premade or processed foods. These foods are often high in fat, salt (sodium), and added sugar. Cooking  Use olive oil instead of butter when cooking. Oils like olive oil, canola oil, and sunflower oil contain healthy fats. Meal planning  Learn which foods do or do not affect you. If you find out that a food tends to cause your gout symptoms to flare up, avoid eating that food. You can enjoy foods that do not cause problems. If you have any questions about a food item, talk with your dietitian or health care provider.  Limit foods high in fat, especially saturated fat. Fat makes it harder for your body to get rid of uric acid.  Choose foods that are lower in fat and are lean sources of protein. General guidelines  Limit alcohol intake to no more than 1 drink a day for nonpregnant women and 2 drinks a day for men. One drink equals 12 oz of beer, 5 oz of wine, or 1 oz of hard liquor. Alcohol can affect the way your body gets rid of uric acid.  Drink plenty of water to keep your urine clear or pale yellow. Fluids can help  remove uric acid from your body.  If directed by your health care provider, take a vitamin C supplement.  Work with your health care provider and dietitian to develop a plan to achieve or maintain a healthy weight. Losing weight can help reduce uric acid in your blood. What foods are recommended? The items listed may not be a complete list. Talk with your dietitian about what dietary choices are best for you. Foods low in purines Foods low in purines do not need to be limited. These include:  All fruits.  All low-purine vegetables, pickles, and olives.  Breads, pasta, rice, cornbread, and popcorn. Cake and other baked goods.  All dairy foods.  Eggs, nuts, and nut butters.  Spices and condiments, such as salt, herbs, and vinegar.  Plant oils, butter, and margarine.  Water, sugar-free soft drinks, tea, coffee, and cocoa.  Vegetable-based soups, broths, sauces, and gravies. Foods moderate in purines Foods moderate in purines should be limited to the amounts listed.   cup of asparagus, cauliflower, spinach, mushrooms, or green peas, each day.  2/3 cup uncooked oatmeal, each day.   cup dry wheat bran or wheat germ, each day.  2-3 ounces of meat or poultry, each day.  4-6 ounces of shellfish, such as crab, lobster, oysters, or shrimp, each day.  1 cup cooked beans, peas, or lentils, each day.  Soup, broths, or bouillon made from meat or   fish. Limit these foods as much as possible. What foods are not recommended? The items listed may not be a complete list. Talk with your dietitian about what dietary choices are best for you. Limit your intake of foods high in purines, including:  Beer and other alcohol.  Meat-based gravy or sauce.  Canned or fresh fish, such as: ? Anchovies, sardines, herring, and tuna. ? Mussels and scallops. ? Codfish, trout, and haddock.  Bacon.  Organ meats, such as: ? Liver or kidney. ? Tripe. ? Sweetbreads (thymus gland or  pancreas).  Wild game or goose.  Yeast or yeast extract supplements.  Drinks sweetened with high-fructose corn syrup. Summary  Eating a low-purine diet can help control conditions caused by too much uric acid in the body, such as gout or kidney stones.  Choose low-purine foods, limit alcohol, and limit foods high in fat.  You will learn over time which foods do or do not affect you. If you find out that a food tends to cause your gout symptoms to flare up, avoid eating that food. This information is not intended to replace advice given to you by your health care provider. Make sure you discuss any questions you have with your health care provider. Document Revised: 12/08/2016 Document Reviewed: 02/09/2016 Elsevier Patient Education  2020 [employer] Gout  Gout is a condition that causes painful swelling of the joints. Gout is a type of inflammation of the joints (arthritis). This condition is caused by having too much uric acid in the body. Uric acid is a chemical that forms when the body breaks down substances called purines. Purines are important for building body proteins. When the body has too much uric acid, sharp crystals can form and build up inside the joints. This causes pain and swelling. Gout attacks can happen quickly and may be very painful (acute gout). Over time, the attacks can affect more joints and become more frequent (chronic gout). Gout can also cause uric acid to build up under the skin and inside the kidneys. What are the causes? This condition is caused by too much uric acid in your blood. This can happen because:  Your kidneys do not remove enough uric acid from your blood. This is the most common cause.  Your body makes too much uric acid. This can happen with some cancers and cancer treatments. It can also occur if your body is breaking down too many red blood cells (hemolytic anemia).  You eat too many foods that are high in purines. These foods include  organ meats and some seafood. Alcohol, especially beer, is also high in purines. A gout attack may be triggered by trauma or stress. What increases the risk? You are more likely to develop this condition if you:  Have a family history of gout.  Are male and middle-aged.  Are male and have gone through menopause.  Are obese.  Frequently drink alcohol, especially beer.  Are dehydrated.  Lose weight too quickly.  Have an organ transplant.  Have lead poisoning.  Take certain medicines, including aspirin, cyclosporine, diuretics, levodopa, and niacin.  Have kidney disease.  Have a skin condition called psoriasis. What are the signs or symptoms? An attack of acute gout happens quickly. It usually occurs in just one joint. The most common place is the big toe. Attacks often start at night. Other joints that may be affected include joints of the feet, ankle, knee, fingers, wrist, or elbow. Symptoms of this condition may include:  Severe pain.    Warmth.  Swelling.  Stiffness.  Tenderness. The affected joint may be very painful to touch.  Shiny, red, or purple skin.  Chills and fever. Chronic gout may cause symptoms more frequently. More joints may be involved. You may also have white or yellow lumps (tophi) on your hands or feet or in other areas near your joints. How is this diagnosed? This condition is diagnosed based on your symptoms, medical history, and physical exam. You may have tests, such as:  Blood tests to measure uric acid levels.  Removal of joint fluid with a thin needle (aspiration) to look for uric acid crystals.  X-rays to look for joint damage. How is this treated? Treatment for this condition has two phases: treating an acute attack and preventing future attacks. Acute gout treatment may include medicines to reduce pain and swelling, including:  NSAIDs.  Steroids. These are strong anti-inflammatory medicines that can be taken by mouth (orally) or  injected into a joint.  Colchicine. This medicine relieves pain and swelling when it is taken soon after an attack. It can be given by mouth or through an IV. Preventive treatment may include:  Daily use of smaller doses of NSAIDs or colchicine.  Use of a medicine that reduces uric acid levels in your blood.  Changes to your diet. You may need to see a dietitian about what to eat and drink to prevent gout. Follow these instructions at home: During a gout attack   If directed, put ice on the affected area: ? Put ice in a plastic bag. ? Place a towel between your skin and the bag. ? Leave the ice on for 20 minutes, 2-3 times a day.  Raise (elevate) the affected joint above the level of your heart as often as possible.  Rest the joint as much as possible. If the affected joint is in your leg, you may be given crutches to use.  Follow instructions from your health care provider about eating or drinking restrictions. Avoiding future gout attacks  Follow a low-purine diet as told by your dietitian or health care provider. Avoid foods and drinks that are high in purines, including liver, kidney, anchovies, asparagus, herring, mushrooms, mussels, and beer.  Maintain a healthy weight or lose weight if you are overweight. If you want to lose weight, talk with your health care provider. It is important that you do not lose weight too quickly.  Start or maintain an exercise program as told by your health care provider. Eating and drinking  Drink enough fluids to keep your urine pale yellow.  If you drink alcohol: ? Limit how much you use to:  0-1 drink a day for women.  0-2 drinks a day for men. ? Be aware of how much alcohol is in your drink. In the [place] one drink equals one 12 oz bottle of beer (355 mL) one 5 oz glass of wine (148 mL), or one 1 oz glass of hard liquor (44 mL). General instructions  Take over-the-counter and prescription medicines only as told by your health care  provider.  Do not drive or use heavy machinery while taking prescription pain medicine.  Return to your normal activities as told by your health care provider. Ask your health care provider what activities are safe for you.  Keep all follow-up visits as told by your health care provider. This is important. Contact a health care provider if you have:  Another gout attack.  Continuing symptoms of a gout attack after 10 days of   treatment.  Side effects from your medicines.  Chills or a fever.  Burning pain when you urinate.  Pain in your lower back or belly. Get help right away if you:  Have severe or uncontrolled pain.  Cannot urinate. Summary  Gout is painful swelling of the joints caused by inflammation.  The most common site of pain is the big toe, but it can affect other joints in the body.  Medicines and dietary changes can help to prevent and treat gout attacks. This information is not intended to replace advice given to you by your health care provider. Make sure you discuss any questions you have with your health care provider. Document Revised: 07/18/2017 Document Reviewed: 07/18/2017 Elsevier Patient Education  2020 [employer] Inc[employer]  

## 2019-12-10 ENCOUNTER — Ambulatory Visit
Admission: RE | Admit: 2019-12-10 | Discharge: 2019-12-10 | Disposition: A | Discharge status: Home / Self Care | Payer: BC Managed Care – PPO | Attending: Family Medicine | Admitting: Family Medicine

## 2019-12-10 ENCOUNTER — Ambulatory Visit
Admission: RE | Admit: 2019-12-10 | Discharge: 2019-12-10 | Disposition: A | Discharge status: Home / Self Care | Payer: BC Managed Care – PPO | Source: Ambulatory Visit | Attending: Family Medicine | Admitting: Family Medicine

## 2019-12-10 ENCOUNTER — Other Ambulatory Visit: Payer: Self-pay

## 2019-12-10 DIAGNOSIS — M1000 Idiopathic gout, unspecified site: Secondary | ICD-10-CM | POA: Diagnosis not present

## 2019-12-10 DIAGNOSIS — M79672 Pain in left foot: Secondary | ICD-10-CM | POA: Diagnosis not present

## 2019-12-10 DIAGNOSIS — Z8739 Personal history of other diseases of the musculoskeletal system and connective tissue: Secondary | ICD-10-CM | POA: Diagnosis not present

## 2019-12-11 LAB — CBC WITH DIFFERENTIAL/PLATELET
Basophils Absolute: 0 10*3/uL (ref 0.0–0.2)
Basos: 0 %
EOS (ABSOLUTE): 0.2 10*3/uL (ref 0.0–0.4)
Eos: 3 %
Hematocrit: 45.7 % (ref 37.5–51.0)
Hemoglobin: 15.7 g/dL (ref 13.0–17.7)
Immature Grans (Abs): 0 10*3/uL (ref 0.0–0.1)
Immature Granulocytes: 1 %
Lymphocytes Absolute: 1.8 10*3/uL (ref 0.7–3.1)
Lymphs: 24 %
MCH: 31.3 pg (ref 26.6–33.0)
MCHC: 34.4 g/dL (ref 31.5–35.7)
MCV: 91 fL (ref 79–97)
Monocytes Absolute: 0.7 10*3/uL (ref 0.1–0.9)
Monocytes: 9 %
Neutrophils Absolute: 4.9 10*3/uL (ref 1.4–7.0)
Neutrophils: 63 %
Platelets: 294 10*3/uL (ref 150–450)
RBC: 5.01 x10E6/uL (ref 4.14–5.80)
RDW: 13 % (ref 11.6–15.4)
WBC: 7.7 10*3/uL (ref 3.4–10.8)

## 2019-12-11 LAB — SEDIMENTATION RATE: Sed Rate: 13 mm/hr (ref 0–15)

## 2019-12-11 LAB — URIC ACID: Uric Acid: 8.3 mg/dL (ref 3.8–8.4)

## 2019-12-12 ENCOUNTER — Other Ambulatory Visit: Payer: Self-pay | Admitting: *Deleted

## 2019-12-12 DIAGNOSIS — M1000 Idiopathic gout, unspecified site: Secondary | ICD-10-CM

## 2019-12-12 MED ORDER — ALLOPURINOL 100 MG PO TABS: 100.0000 mg | ORAL_TABLET | Freq: Every day | ORAL | 3 refills | Status: DC

## 2020-02-11 ENCOUNTER — Ambulatory Visit: Payer: BC Managed Care – PPO | Admitting: Family Medicine

## 2020-02-11 ENCOUNTER — Encounter: Payer: Self-pay | Admitting: Family Medicine

## 2020-02-11 ENCOUNTER — Other Ambulatory Visit: Payer: Self-pay

## 2020-02-11 VITALS — BP 132/90 | HR 62 | Temp 98.3°F | Resp 16 | Ht 70.0 in | Wt 242.0 lb

## 2020-02-11 DIAGNOSIS — I10 Essential (primary) hypertension: Secondary | ICD-10-CM | POA: Diagnosis not present

## 2020-02-11 DIAGNOSIS — M1000 Idiopathic gout, unspecified site: Secondary | ICD-10-CM

## 2020-02-11 NOTE — Patient Instructions (Signed)

## 2020-02-11 NOTE — Progress Notes (Signed)
Established patient visit   Patient: Gabriel Larson   DOB: 1975/06/29   45 y.o. Male  MRN: [id number] Visit Date: 02/11/2020  Today's healthcare provider: Megan Mans, MD   Chief Complaint  Patient presents with  . Hypertension   Subjective    HPI  Patient feels well overall and is working on diet and exercise.  Gout has been controlled recently. Hypertension, follow-up  BP Readings from Last 3 Encounters:  02/11/20 132/90  12/09/19 137/88  05/06/19 129/87   Wt Readings from Last 3 Encounters:  02/11/20 242 lb (109.8 kg)  12/09/19 253 lb (114.8 kg)  05/06/19 239 lb 6.4 oz (108.6 kg)     He was last seen for hypertension 10 months ago.  BP at that visit was 137/88. Management since that visit includes; on amlodipine and metoprolol. He reports excellent compliance with treatment. He is not having side effects.  He is not exercising. He is adherent to low salt diet.   Outside blood pressures are stable.  He does not smoke.  Use of agents associated with hypertension: none.   ---------------------------------------------------------------------------------------------------  Acute idiopathic gout, unspecified site From 12/09/2019-seen by Dortha Kern. Onset of pain and swelling of the left 1st MTP and ankle since 11-27-19. Used cherry juice and Advil with improvement of discomfort and swelling. With history of dropping a roll of wire on the same joint today, will schedule labs and x-ray evaluation. May use Aleve prn inflammation. Given Low Purine Diet plan. Recheck pending lab reports. Labs showed-Uric acid level above the goal for a patient with gout.  Patient Active Problem List   Diagnosis Date Noted  . 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 Adaptation reaction 05/25/2014  . Anxiety 05/25/2014  . Dips tobacco 05/25/2014  . 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 Dyslipidemia 05/25/2014  . Essential (primary) hypertension 05/25/2014  . Blood glucose  elevated 05/25/2014  . Cannot sleep 05/25/2014  . Malaise and fatigue 05/25/2014  . Adiposity 05/25/2014  . Obstructive apnea 05/25/2014  . 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 Smoking status: Never Smoker  . Smokeless tobacco: Current User    Types: Chew  Substance Use Topics  . Alcohol use: Yes  . Drug use: No   Allergies  Allergen Reactions  . Erythromycin     GI issues  . Metronidazole Other (See Comments)    GI issues  . Penicillins        Medications: Outpatient Medications Prior to Visit  Medication Sig  . allopurinol (ZYLOPRIM) 100 MG tablet Take 1 tablet (100 mg total) by mouth daily.  Marland Kitchen amLODipine-olmesartan (AZOR) 5-40 MG tablet TAKE 1 TABLET DAILY (KEEP FOLLOW UP APPOINTMENT )  . aspirin EC 81 MG tablet Take 81 mg by mouth daily.  . hydrochlorothiazide (HYDRODIURIL) 25 MG tablet TAKE 1 TABLET DAILY (KEEP FOLLOW UP APPOINTMENT)  . metoprolol succinate (TOPROL-XL) 100 MG 24 hr tablet TAKE 1 TABLET DAILY WITH OR IMMEDIATELY FOLLOWING A MEAL  . mometasone (ELOCON) 0.1 % cream Apply 1 application topically daily. Only use 2 weeks at a time  . MULTIPLE VITAMIN PO Take 1 tablet by mouth daily.  Marland Kitchen venlafaxine XR (EFFEXOR-XR) 150 MG 24 hr capsule TAKE 1 CAPSULE DAILY (KEEP FOLLOW UP APPOINTMENT, NOT SEEN SINCE JULY 2019)   No facility-administered medications prior to visit.    Review of Systems  Constitutional: Negative for appetite change, chills and fever.  Respiratory: Negative for chest tightness, shortness of  breath and wheezing.   Cardiovascular: Positive for leg swelling. Negative for chest pain and palpitations.  Gastrointestinal: Negative for abdominal pain, nausea and vomiting.  Musculoskeletal: Positive for myalgias.    Last CBC Lab Results  Component Value Date   WBC 7.7 12/10/2019   HGB 15.7 12/10/2019   HCT 45.7 12/10/2019   MCV 91 12/10/2019   MCH 31.3 12/10/2019   RDW 13.0 12/10/2019   PLT 294 12/10/2019    Last hemoglobin A1c Lab Results  Component Value Date   HGBA1C 5.7 (H) 05/21/2015   Last thyroid functions Lab Results  Component Value Date   TSH 3.160 02/21/2017       Objective    BP 132/90 (BP Location: Right Arm, Patient Position: Sitting, Cuff Size: Large)   Pulse 62   Temp 98.3 F (36.8 C) (Oral)   Resp 16   Ht 5\' 10"  (1.778 m)   Wt 242 lb (109.8 kg)   SpO2 98%   BMI 34.72 kg/m  BP Readings from Last 3 Encounters:  02/11/20 132/90  12/09/19 137/88  05/06/19 129/87   Wt Readings from Last 3 Encounters:  02/11/20 242 lb (109.8 kg)  12/09/19 253 lb (114.8 kg)  05/06/19 239 lb 6.4 oz (108.6 kg)     Patient is no acute distress.  CLR regular rate rhythm lungs clear Remedies show no gouty flares.  No edema    No results found for any visits on 02/11/20.  Assessment & Plan     1. Essential (primary) hypertension stop HCTZ and follow-up in 2 months.  2. Acute idiopathic gout, unspecified site Goal uric acid less than 6. - Uric acid   No follow-ups on file.      I, 04/10/20, MD, have reviewed all documentation for this visit. The documentation on 02/18/20 for the exam, diagnosis, procedures, and orders are all accurate and complete.    Szymon Foiles 04/17/20, MD  [facility] [phone] (phone) [phone] (fax)  [facility]

## 2020-02-12 ENCOUNTER — Ambulatory Visit: Payer: Self-pay | Admitting: Family Medicine

## 2020-02-17 DIAGNOSIS — M1000 Idiopathic gout, unspecified site: Secondary | ICD-10-CM | POA: Diagnosis not present

## 2020-02-18 LAB — URIC ACID: Uric Acid: 6.5 mg/dL (ref 3.8–8.4)

## 2020-04-14 ENCOUNTER — Ambulatory Visit: Payer: Self-pay | Admitting: Family Medicine

## 2020-05-06 ENCOUNTER — Encounter: Payer: Self-pay | Admitting: Family Medicine

## 2020-05-06 ENCOUNTER — Ambulatory Visit (INDEPENDENT_AMBULATORY_CARE_PROVIDER_SITE_OTHER): Payer: BC Managed Care – PPO | Admitting: Family Medicine

## 2020-05-06 ENCOUNTER — Other Ambulatory Visit: Payer: Self-pay

## 2020-05-06 VITALS — BP 150/94 | HR 62 | Temp 98.7°F | Resp 16 | Ht 70.0 in | Wt 245.0 lb

## 2020-05-06 DIAGNOSIS — Z125 Encounter for screening for malignant neoplasm of prostate: Secondary | ICD-10-CM | POA: Diagnosis not present

## 2020-05-06 DIAGNOSIS — Z0000 Encounter for general adult medical examination without abnormal findings: Secondary | ICD-10-CM

## 2020-05-06 DIAGNOSIS — I10 Essential (primary) hypertension: Secondary | ICD-10-CM

## 2020-05-06 DIAGNOSIS — E785 Hyperlipidemia, unspecified: Secondary | ICD-10-CM

## 2020-05-06 DIAGNOSIS — Z1389 Encounter for screening for other disorder: Secondary | ICD-10-CM

## 2020-05-06 DIAGNOSIS — M1000 Idiopathic gout, unspecified site: Secondary | ICD-10-CM

## 2020-05-06 LAB — POCT URINALYSIS DIPSTICK
Bilirubin, UA: NEGATIVE
Blood, UA: NEGATIVE
Glucose, UA: NEGATIVE
Ketones, UA: NEGATIVE
Leukocytes, UA: NEGATIVE
Nitrite, UA: NEGATIVE
Protein, UA: POSITIVE — AB
Spec Grav, UA: 1.015 (ref 1.010–1.025)
Urobilinogen, UA: 0.2 E.U./dL
pH, UA: 6.5 (ref 5.0–8.0)

## 2020-05-06 MED ORDER — ALLOPURINOL 100 MG PO TABS: 100.0000 mg | ORAL_TABLET | Freq: Two times a day (BID) | ORAL | 3 refills | Status: DC

## 2020-05-06 NOTE — Progress Notes (Signed)
Complete physical exam   Patient: Gabriel Larson   DOB: 05-21-1975   45 y.o. Male  MRN: [id number] Visit Date: 05/06/2020  Today's healthcare provider: Wilhemena Durie, MD   Chief Complaint  Patient presents with  . Annual Exam   Subjective    Gabriel Larson is a 45 y.o. male who presents today for a complete physical exam.  He reports consuming a general diet. The patient does not participate in regular exercise at present. He generally feels fairly well. He reports sleeping fairly well. He does have additional problems to discuss today.   He is not exercising regularly and knows that he needs to do so.  Blood pressures at home runs about 140/86. Follow up for gout  The patient was last seen for this 2 months ago. Changes made at last visit include no medication changes. However, patient reports that he is taking allopurinol  270m instead of 1052m  He reports good compliance with treatment. He feels that condition is Worse. He is not having side effects.      Past Medical History:  Diagnosis Date  . Hypertension    Past Surgical History:  Procedure Laterality Date  . APPENDECTOMY    . TONSILLECTOMY     Social History   Socioeconomic History  . Marital status: Married    Spouse name: Not on file  . Number of children: Not on file  . Years of education: Not on file  . Highest education level: Not on file  Occupational History  . Not on file  Tobacco Use  . Smoking status: Never Smoker  . Smokeless tobacco: Current User    Types: Chew  Substance and Sexual Activity  . Alcohol use: Yes  . Drug use: No  . Sexual activity: Not on file  Other Topics Concern  . Not on file  Social History Narrative  . Not on file   Social Determinants of Health   Financial Resource Strain: Not on file  Food Insecurity: Not on file  Transportation Needs: Not on file  Physical Activity: Not on file  Stress: Not on file  Social Connections: Not on file   Intimate Partner Violence: Not on file   Family Status  Relation Name Status  . Father  Deceased  . Sister  Deceased       Died from respiratory failure of unknown cause  . Mother  (Not Specified)   Family History  Problem Relation Age of Onset  . Heart disease Father        Died from MI  . Hypertension Father   . Diabetes Father   . Ulcers Father   . Kidney disease Sister   . Hypertension Mother   . Diabetes Mother   . Heart disease Mother        CAD  . Cancer Mother        Bone and Multiple Myeloma  . Kidney disease Mother    Allergies  Allergen Reactions  . Erythromycin     GI issues  . Metronidazole Other (See Comments)    GI issues  . Penicillins     Patient Care Team: GiJerrol Banana MD as PCP - General (Family Medicine)   Medications: Outpatient Medications Prior to Visit  Medication Sig  . allopurinol (ZYLOPRIM) 100 MG tablet Take 1 tablet (100 mg total) by mouth daily.  . Marland KitchenmLODipine-olmesartan (AZOR) 5-40 MG tablet TAKE 1 TABLET DAILY (KEEP FOLLOW UP APPOINTMENT )  .  aspirin EC 81 MG tablet Take 81 mg by mouth daily.  . metoprolol succinate (TOPROL-XL) 100 MG 24 hr tablet TAKE 1 TABLET DAILY WITH OR IMMEDIATELY FOLLOWING A MEAL  . mometasone (ELOCON) 0.1 % cream Apply 1 application topically daily. Only use 2 weeks at a time  . MULTIPLE VITAMIN PO Take 1 tablet by mouth daily.  Marland Kitchen venlafaxine XR (EFFEXOR-XR) 150 MG 24 hr capsule TAKE 1 CAPSULE DAILY (KEEP FOLLOW UP APPOINTMENT, NOT SEEN SINCE JULY 2019)   No facility-administered medications prior to visit.    Review of Systems  All other systems reviewed and are negative.      Objective    BP (!) 150/94   Pulse 62   Temp 98.7 F (37.1 C)   Resp 16   Ht _0  (1.778 m)   Wt 245 lb (111.1 kg)   BMI 35.15 kg/m  BP Readings from Last 3 Encounters:  05/06/20 (!) 150/94  02/11/20 132/90  12/09/19 137/88   Wt Readings from Last 3 Encounters:  05/06/20 245 lb (111.1 kg)  02/11/20  242 lb (109.8 kg)  12/09/19 253 lb (114.8 kg)      Physical Exam Vitals reviewed.  Constitutional:      Appearance: He is well-developed. He is obese.  HENT:     Head: Normocephalic and atraumatic.     Right Ear: Tympanic membrane and external ear normal.     Left Ear: Tympanic membrane and external ear normal.     Nose: Nose normal.     Mouth/Throat:     Mouth: Mucous membranes are moist.     Pharynx: Oropharynx is clear.  Eyes:     General: No scleral icterus.    Conjunctiva/sclera: Conjunctivae normal.  Neck:     Thyroid: No thyromegaly.     Vascular: No carotid bruit.  Cardiovascular:     Rate and Rhythm: Normal rate and regular rhythm.     Heart sounds: Normal heart sounds.  Pulmonary:     Effort: Pulmonary effort is normal.     Breath sounds: Normal breath sounds.  Abdominal:     Palpations: Abdomen is soft.  Genitourinary:    Penis: Normal.      Testes: Normal.  Musculoskeletal:     Right lower leg: No edema.     Left lower leg: No edema.  Lymphadenopathy:     Cervical: No cervical adenopathy.  Skin:    General: Skin is warm and dry.     Comments: Very fair skin.  Neurological:     General: No focal deficit present.     Mental Status: He is alert and oriented to person, place, and time.  Psychiatric:        Mood and Affect: Mood normal.        Behavior: Behavior normal.        Thought Content: Thought content normal.        Judgment: Judgment normal.       Last depression screening scores PHQ 2/9 Scores 05/06/2020 05/06/2019 02/21/2017  PHQ - 2 Score 0 0 0  PHQ- 9 Score 2 4 -   Last fall risk screening Fall Risk  05/06/2020  Falls in the past year? 0  Number falls in past yr: 0  Injury with Fall? 0  Risk for fall due to : No Fall Risks  Follow up Falls evaluation completed   Last Audit-C alcohol use screening Alcohol Use Disorder Test (AUDIT) 05/06/2020  1. How often do you have  a drink containing alcohol? 3  2. How many drinks containing  alcohol do you have on a typical day when you are drinking? 0  3. How often do you have six or more drinks on one occasion? 0  AUDIT-C Score 3  4. How often during the last year have you found that you were not able to stop drinking once you had started? 0  5. How often during the last year have you failed to do what was normally expected from you because of drinking? 0  6. How often during the last year have you needed a first drink in the morning to get yourself going after a heavy drinking session? 0  7. How often during the last year have you had a feeling of guilt of remorse after drinking? 0  8. How often during the last year have you been unable to remember what happened the night before because you had been drinking? 0  9. Have you or someone else been injured as a result of your drinking? 0  10. Has a relative or friend or a doctor or another health worker been concerned about your drinking or suggested you cut down? 0  Alcohol Use Disorder Identification Test Final Score (AUDIT) 3   A score of 3 or more in women, and 4 or more in men indicates increased risk for alcohol abuse, EXCEPT if all of the points are from question 1   No results found for any visits on 05/06/20.  Assessment & Plan    Routine Health Maintenance and Physical Exam  Exercise Activities and Dietary recommendations Goals   None     Immunization History  Administered Date(s) Administered  . DTaP 02/03/1974, 04/02/1974, 06/06/1974, 06/10/1975, 07/18/1979  . Hep A / Hep B 08/29/2005, 10/04/2005, 03/07/2006  . IPV 02/03/1974, 04/02/1974, 06/06/1974, 10/10/1975, 07/18/1979  . Influenza Split 01/26/2010  . Influenza,inj,Quad PF,6+ Mos 12/23/2013, 10/25/2015  . Influenza-Unspecified 11/10/2014  . MMR 11/03/1974  . PFIZER SARS-COV-2 Pediatric Vaccination 5-83yr 03/27/2019, 04/21/2019, 12/30/2019  . PFIZER(Purple Top)SARS-COV-2 Vaccination 03/27/2019, 04/21/2019  . Td 08/26/2002  . Tdap 01/17/2012  . Typhoid  Inactivated 08/29/2005    Health Maintenance  Topic Date Due  . Hepatitis C Screening  Never done  . HIV Screening  Never done  . COLONOSCOPY (Pts 45-457yrInsurance coverage will need to be confirmed)  11/07/2018  . COVID-19 Vaccine (3 - Booster for Pfizer series) 06/29/2020  . INFLUENZA VACCINE  08/09/2020  . TETANUS/TDAP  01/16/2022  . HPV VACCINES  Aged Out    Discussed health benefits of physical activity, and encouraged him to engage in regular exercise appropriate for his age and condition.  1. Annual physical exam Follow-up 1 year.  Time for Follow-up colonoscopy in the next year also.  2. Essential (primary) hypertension Follow-up on blood pressure readings with diet and exercise also. I think significant weight loss would address this issue completely. Patient has long work hours and has difficulty with the schedule. - TSH - CBC w/Diff/Platelet - Comprehensive Metabolic Panel (CMET)  3. Dyslipidemia  - Lipid panel  4. Screening for prostate cancer  - PSA  5. Screening for blood or protein in urine  - POCT urinalysis dipstick  6. Acute idiopathic gout, unspecified site Discussed and will treat with allopurinol 100 mg 2 daily.  Goal uric acid less than 6.  - Uric acid - allopurinol (ZYLOPRIM) 100 MG tablet; Take 1 tablet (100 mg total) by mouth 2 (two) times daily.  Dispense: 180 tablet;  Refill: 3   No follow-ups on file.     I, Wilhemena Durie, MD, have reviewed all documentation for this visit. The documentation on 05/11/20 for the exam, diagnosis, procedures, and orders are all accurate and complete.    Darrah Dredge Cranford Mon, MD  [facility] [phone] (phone) [phone] (fax)  [place]

## 2020-05-24 ENCOUNTER — Other Ambulatory Visit: Payer: Self-pay | Admitting: Family Medicine

## 2020-05-24 DIAGNOSIS — I10 Essential (primary) hypertension: Secondary | ICD-10-CM

## 2020-06-21 ENCOUNTER — Telehealth: Payer: Self-pay

## 2020-06-21 DIAGNOSIS — M1000 Idiopathic gout, unspecified site: Secondary | ICD-10-CM

## 2020-06-21 MED ORDER — ALLOPURINOL 100 MG PO TABS: 100.0000 mg | ORAL_TABLET | Freq: Two times a day (BID) | ORAL | 3 refills | Status: AC

## 2020-06-21 NOTE — Telephone Encounter (Signed)
Medication sent into the pharmacy. 

## 2020-06-21 NOTE — Telephone Encounter (Signed)
[facility] faxed refill request for the following medications: ? ? ?allopurinol (ZYLOPRIM) 100 MG tablet  ? ?Please advise. ? ?

## 2020-06-24 ENCOUNTER — Other Ambulatory Visit: Payer: Self-pay | Admitting: Family Medicine

## 2020-06-24 NOTE — Telephone Encounter (Signed)
Requested Prescriptions  Pending Prescriptions Disp Refills  . venlafaxine XR (EFFEXOR-XR) 150 MG 24 hr capsule [Pharmacy Med Name: VENLAFAXINE HCL ER CAPS 150MG ] 90 capsule 3    Sig: TAKE 1 CAPSULE DAILY (KEEP FOLLOW UP APPOINTMENT, NOT SEEN SINCE JULY 2019)     Psychiatry: Antidepressants - SNRI - desvenlafaxine & venlafaxine Failed - 06/24/2020  1:12 AM      Failed - LDL in normal range and within 360 days    LDL Calculated  Date Value Ref Range Status  02/21/2017 67 0 - 99 mg/dL Final         Failed - Total Cholesterol in normal range and within 360 days    Cholesterol, Total  Date Value Ref Range Status  02/21/2017 175 100 - 199 mg/dL Final         Failed - Triglycerides in normal range and within 360 days    Triglycerides  Date Value Ref Range Status  02/21/2017 375 (H) 0 - 149 mg/dL Final         Failed - Last BP in normal range    BP Readings from Last 1 Encounters:  05/06/20 (!) 150/94         Passed - Completed PHQ-2 or PHQ-9 in the last 360 days      Passed - Valid encounter within last 6 months    Recent Outpatient Visits          1 month ago Annual physical exam   [facility]   4 months ago Essential (primary) hypertension   [facility] [place]   6 months ago Acute idiopathic gout, unspecified site   [facility] Chrismon, [facility], PA-C   1 year ago Annual physical exam   [facility] [place]   2 years ago Essential (primary) hypertension   [facility] MD      Future Appointments            In 1 month Maple Hudson., MD [facility]           . hydrochlorothiazide (HYDRODIURIL) 25 MG tablet [Pharmacy Med Name: HYDROCHLOROTHIAZIDE TABS 25MG ] 90 tablet 3    Sig: TAKE 1 TABLET DAILY (KEEP FOLLOW UP APPOINTMENT)     Cardiovascular: Diuretics - Thiazide Failed -  06/24/2020  1:12 AM      Failed - Ca in normal range and within 360 days    Calcium  Date Value Ref Range Status  02/21/2017 9.6 8.7 - 10.2 mg/dL Final         Failed - Cr in normal range and within 360 days    Creatinine, Ser  Date Value Ref Range Status  02/21/2017 1.24 0.76 - 1.27 mg/dL Final         Failed - K in normal range and within 360 days    Potassium  Date Value Ref Range Status  02/21/2017 3.8 3.5 - 5.2 mmol/L Final         Failed - Na in normal range and within 360 days    Sodium  Date Value Ref Range Status  02/21/2017 142 134 - 144 mmol/L Final         Failed - Last BP in normal range    BP Readings from Last 1 Encounters:  05/06/20 (!) 150/94         Passed - Valid encounter  within last 6 months    Recent Outpatient Visits          1 month ago Annual physical exam   [facility] Maple Hudson., MD   4 months ago Essential (primary) hypertension   [facility] Maple Hudson., MD   6 months ago Acute idiopathic gout, unspecified site   [facility] Chrismon, Jodell Cipro, PA-C   1 year ago Annual physical exam   [facility] Maple Hudson., MD   2 years ago Essential (primary) hypertension   [facility] Maple Hudson., MD      Future Appointments            In 1 month Maple Hudson., MD [facility]

## 2020-07-07 ENCOUNTER — Other Ambulatory Visit: Payer: Self-pay | Admitting: Family Medicine

## 2020-08-10 ENCOUNTER — Ambulatory Visit: Payer: Self-pay | Admitting: Family Medicine

## 2020-08-23 ENCOUNTER — Other Ambulatory Visit: Payer: Self-pay | Admitting: Family Medicine

## 2020-08-23 DIAGNOSIS — I10 Essential (primary) hypertension: Secondary | ICD-10-CM

## 2020-08-26 ENCOUNTER — Ambulatory Visit: Payer: Self-pay | Admitting: Family Medicine

## 2020-09-06 DIAGNOSIS — I10 Essential (primary) hypertension: Secondary | ICD-10-CM | POA: Diagnosis not present

## 2020-09-06 DIAGNOSIS — M1000 Idiopathic gout, unspecified site: Secondary | ICD-10-CM | POA: Diagnosis not present

## 2020-09-06 DIAGNOSIS — E785 Hyperlipidemia, unspecified: Secondary | ICD-10-CM | POA: Diagnosis not present

## 2020-09-06 DIAGNOSIS — Z125 Encounter for screening for malignant neoplasm of prostate: Secondary | ICD-10-CM | POA: Diagnosis not present

## 2020-09-07 LAB — COMPREHENSIVE METABOLIC PANEL
ALT: 36 IU/L (ref 0–44)
AST: 28 IU/L (ref 0–40)
Albumin/Globulin Ratio: 2 (ref 1.2–2.2)
Albumin: 4.8 g/dL (ref 4.0–5.0)
Alkaline Phosphatase: 83 IU/L (ref 44–121)
BUN/Creatinine Ratio: 12 (ref 9–20)
BUN: 15 mg/dL (ref 6–24)
Bilirubin Total: 0.9 mg/dL (ref 0.0–1.2)
CO2: 21 mmol/L (ref 20–29)
Calcium: 8.9 mg/dL (ref 8.7–10.2)
Chloride: 101 mmol/L (ref 96–106)
Creatinine, Ser: 1.26 mg/dL (ref 0.76–1.27)
Globulin, Total: 2.4 g/dL (ref 1.5–4.5)
Glucose: 120 mg/dL — ABNORMAL HIGH (ref 65–99)
Potassium: 4.6 mmol/L (ref 3.5–5.2)
Sodium: 141 mmol/L (ref 134–144)
Total Protein: 7.2 g/dL (ref 6.0–8.5)
eGFR: 71 mL/min/{1.73_m2} (ref >59)

## 2020-09-07 LAB — CBC WITH DIFFERENTIAL/PLATELET
Basophils Absolute: 0.1 10*3/uL (ref 0.0–0.2)
Basos: 1 %
EOS (ABSOLUTE): 0.3 10*3/uL (ref 0.0–0.4)
Eos: 4 %
Hematocrit: 50.3 % (ref 37.5–51.0)
Hemoglobin: 17.1 g/dL (ref 13.0–17.7)
Immature Grans (Abs): 0 10*3/uL (ref 0.0–0.1)
Immature Granulocytes: 0 %
Lymphocytes Absolute: 2.6 10*3/uL (ref 0.7–3.1)
Lymphs: 30 %
MCH: 30.8 pg (ref 26.6–33.0)
MCHC: 34 g/dL (ref 31.5–35.7)
MCV: 91 fL (ref 79–97)
Monocytes Absolute: 0.7 10*3/uL (ref 0.1–0.9)
Monocytes: 8 %
Neutrophils Absolute: 5.1 10*3/uL (ref 1.4–7.0)
Neutrophils: 57 %
Platelets: 245 10*3/uL (ref 150–450)
RBC: 5.56 x10E6/uL (ref 4.14–5.80)
RDW: 13.4 % (ref 11.6–15.4)
WBC: 8.7 10*3/uL (ref 3.4–10.8)

## 2020-09-07 LAB — LIPID PANEL
Chol/HDL Ratio: 5.4 ratio — ABNORMAL HIGH (ref 0.0–5.0)
Cholesterol, Total: 174 mg/dL (ref 100–199)
HDL: 32 mg/dL — ABNORMAL LOW
LDL Chol Calc (NIH): 91 mg/dL (ref 0–99)
Triglycerides: 308 mg/dL — ABNORMAL HIGH (ref 0–149)
VLDL Cholesterol Cal: 51 mg/dL — ABNORMAL HIGH (ref 5–40)

## 2020-09-07 LAB — TSH: TSH: 1.94 u[IU]/mL (ref 0.450–4.500)

## 2020-09-07 LAB — URIC ACID: Uric Acid: 5.3 mg/dL (ref 3.8–8.4)

## 2020-09-07 LAB — PSA: Prostate Specific Ag, Serum: 0.9 ng/mL (ref 0.0–4.0)

## 2020-09-08 ENCOUNTER — Ambulatory Visit: Payer: BC Managed Care – PPO | Admitting: Family Medicine

## 2020-09-08 NOTE — Progress Notes (Deleted)
      Established patient visit   Patient: Gabriel Larson   DOB: 1975-06-24   45 y.o. Male  MRN: [id number] Visit Date: 09/08/2020  Today's healthcare provider: Megan Mans, MD   No chief complaint on file.  Subjective  -------------------------------------------------------------------------------------------------------------------- HPI  Hypertension, follow-up  BP Readings from Last 3 Encounters:  05/06/20 (!) 150/94  02/11/20 132/90  12/09/19 137/88   Wt Readings from Last 3 Encounters:  05/06/20 245 lb (111.1 kg)  02/11/20 242 lb (109.8 kg)  12/09/19 253 lb (114.8 kg)     He was last seen for hypertension 4 months ago.  BP at that visit was 150/94. Management since that visit includes; Follow-up on blood pressure readings with diet and exercise also. He reports {excellent/good/fair/poor:19665} compliance with treatment. He {is/is not:9024} having side effects. {document side effects if present:1} He {is/is not:9024} exercising. He {is/is not:9024} adherent to low salt diet.   Outside blood pressures are {enter patient reported home BP, or 'not being checked':1}.  He {does/does not:200015} smoke.  Use of agents associated with hypertension: {bp agents assoc with hypertension:511::"none"}.   ---------------------------------------------------------------------------------------------------   {Insert patient history into note(optional):23778}  Medications: Outpatient Medications Prior to Visit  Medication Sig   venlafaxine XR (EFFEXOR-XR) 150 MG 24 hr capsule TAKE 1 CAPSULE DAILY (KEEP FOLLOW UP APPOINTMENT, NOT SEEN SINCE JULY 2019)   allopurinol (ZYLOPRIM) 100 MG tablet Take 1 tablet (100 mg total) by mouth 2 (two) times daily.   amLODipine-olmesartan (AZOR) 5-40 MG tablet TAKE 1 TABLET DAILY (KEEP FOLLOW UP APPOINTMENT )   aspirin EC 81 MG tablet Take 81 mg by mouth daily.   metoprolol succinate (TOPROL-XL) 100 MG 24 hr tablet TAKE 1 TABLET DAILY WITH  OR IMMEDIATELY FOLLOWING A MEAL   mometasone (ELOCON) 0.1 % cream Apply 1 application topically daily. Only use 2 weeks at a time   MULTIPLE VITAMIN PO Take 1 tablet by mouth daily.   No facility-administered medications prior to visit.    Review of Systems  Constitutional:  Negative for appetite change, chills and fever.  Respiratory:  Negative for chest tightness, shortness of breath and wheezing.   Cardiovascular:  Negative for chest pain and palpitations.  Gastrointestinal:  Negative for abdominal pain, nausea and vomiting.   {Labs  Heme  Chem  Endocrine  Serology  Results Review (optional):23779}   Objective  -------------------------------------------------------------------------------------------------------------------- There were no vitals taken for this visit. Physical Exam  ***  No results found for any visits on 09/08/20.  Assessment & Plan  ---------------------------------------------------------------------------------------------------------------------- ***  No follow-ups on file.      {provider attestation***:1}   Megan Mans, MD  [facility] [phone] (phone) [phone] (fax)  [facility]

## 2020-12-21 ENCOUNTER — Other Ambulatory Visit: Payer: Self-pay | Admitting: Family Medicine

## 2020-12-21 NOTE — Telephone Encounter (Signed)
Requested medication (s) are due for refill today: yes  Requested medication (s) are on the active medication list: yes  Last refill:  09/22/20  Future visit scheduled: no, canceled 8/18, NO SHOW 8/31  Notes to clinic:  this is mail order and pt should be out of med Wednesday, failed protocol of visit within 6 months with two unkept appt, please assess.   Requested Prescriptions  Pending Prescriptions Disp Refills   venlafaxine XR (EFFEXOR-XR) 150 MG 24 hr capsule [Pharmacy Med Name: VENLAFAXINE HCL ER CAPS 150MG ] 90 capsule 3    Sig: TAKE 1 CAPSULE DAILY (KEEP FOLLOW UP APPOINTMENT, NOT SEEN SINCE JULY 2019)     Psychiatry: Antidepressants - SNRI - desvenlafaxine & venlafaxine Failed - 12/21/2020 12:21 AM      Failed - Triglycerides in normal range and within 360 days    Triglycerides  Date Value Ref Range Status  09/06/2020 308 (H) 0 - 149 mg/dL Final          Failed - Last BP in normal range    BP Readings from Last 1 Encounters:  05/06/20 (!) 150/94          Failed - Valid encounter within last 6 months    Recent Outpatient Visits           7 months ago Annual physical exam   [facility] [place]   10 months ago Essential (primary) hypertension   [facility] [place]   1 year ago Acute idiopathic gout, unspecified site   [facility] Chrismon, [facility], PA-C   1 year ago Annual physical exam   [facility] MD   3 years ago Essential (primary) hypertension   [facility] MD              Passed - LDL in normal range and within 360 days    LDL Chol Calc (NIH)  Date Value Ref Range Status  09/06/2020 91 0 - 99 mg/dL Final          Passed - Total Cholesterol in normal range and within 360 days    Cholesterol, Total  Date Value Ref Range Status  09/06/2020 174 100 - 199 mg/dL Final          Passed -  Completed PHQ-2 or PHQ-9 in the last 360 days

## 2021-01-03 ENCOUNTER — Other Ambulatory Visit: Payer: Self-pay | Admitting: Family Medicine

## 2021-01-04 NOTE — Telephone Encounter (Signed)
Pt called, Left VM to call office and schedule appt for f/up on HTN and med refill

## 2021-01-04 NOTE — Telephone Encounter (Signed)
Courtesy refill.   Requested Prescriptions  Pending Prescriptions Disp Refills   amLODipine-olmesartan (AZOR) 5-40 MG tablet [Pharmacy Med Name: AMLODIPINE/OLMESARTAN TABS 5/40MG ] 30 tablet 0    Sig: TAKE 1 TABLET DAILY (KEEP FOLLOW UP APPOINTMENT )     Cardiovascular: CCB + ARB Combos Failed - 01/03/2021 12:54 AM      Failed - Last BP in normal range    BP Readings from Last 1 Encounters:  05/06/20 (!) 150/94         Failed - Valid encounter within last 6 months    Recent Outpatient Visits          8 months ago Annual physical exam   [facility] Maple Hudson., MD   10 months ago Essential (primary) hypertension   [facility] Maple Hudson., MD   1 year ago Acute idiopathic gout, unspecified site   [facility] Chrismon, Jodell Cipro, PA-C   1 year ago Annual physical exam   [facility] Maple Hudson., MD   3 years ago Essential (primary) hypertension   [facility] Maple Hudson., MD             Passed - K in normal range and within 180 days    Potassium  Date Value Ref Range Status  09/06/2020 4.6 3.5 - 5.2 mmol/L Final         Passed - Cr in normal range and within 180 days    Creatinine, Ser  Date Value Ref Range Status  09/06/2020 1.26 0.76 - 1.27 mg/dL Final         Passed - Patient is not pregnant

## 2021-02-18 ENCOUNTER — Other Ambulatory Visit: Payer: Self-pay | Admitting: Family Medicine

## 2021-02-18 DIAGNOSIS — I10 Essential (primary) hypertension: Secondary | ICD-10-CM

## 2021-02-18 NOTE — Telephone Encounter (Signed)
Requested medications are due for refill today.  yes  Requested medications are on the active medications list.  yes  Last refill. 08/23/2020 #90 1 refill  Future visit scheduled.   no  Notes to clinic.  Pt is more than 3 months overdue for office visit.    Requested Prescriptions  Pending Prescriptions Disp Refills   metoprolol succinate (TOPROL-XL) 100 MG 24 hr tablet [Pharmacy Med Name: METOPROLOL SUCCINATE ER TABS 100MG ] 90 tablet 3    Sig: TAKE 1 TABLET DAILY WITH OR IMMEDIATELY FOLLOWING A MEAL     Cardiovascular:  Beta Blockers Failed - 02/18/2021  1:26 AM      Failed - Last BP in normal range    BP Readings from Last 1 Encounters:  05/06/20 (!) 150/94          Failed - Valid encounter within last 6 months    Recent Outpatient Visits           9 months ago Annual physical exam   [facility] [place]   1 year ago Essential (primary) hypertension   [facility] MD   1 year ago Acute idiopathic gout, unspecified site   [facility] Chrismon, [facility], PA-C   1 year ago Annual physical exam   [facility] MD   3 years ago Essential (primary) hypertension   [facility] MD              Passed - Last Heart Rate in normal range    Pulse Readings from Last 1 Encounters:  05/06/20 62

## 2021-03-21 ENCOUNTER — Other Ambulatory Visit: Payer: Self-pay | Admitting: Family Medicine

## 2021-03-22 ENCOUNTER — Other Ambulatory Visit: Payer: Self-pay | Admitting: Family Medicine

## 2021-03-22 NOTE — Telephone Encounter (Signed)
Requested medication (s) are due for refill today: yes ? ?Requested medication (s) are on the active medication list: yes ? ?Last refill:  01/04/21 #30 with 0 RF ? ?Future visit scheduled: no, NO SHOW in August ? ?Notes to clinic:  Has already had a curtesy refill and there is no upcoming appointment scheduled. ? ? ? ?  ? ?Requested Prescriptions  ?Pending Prescriptions Disp Refills  ? amLODipine-olmesartan (AZOR) 5-40 MG tablet [Pharmacy Med Name: AMLODIPINE/OLMESARTAN TABS 5/40MG ] 30 tablet 11  ?  Sig: TAKE 1 TABLET DAILY (KEEP FOLLOW UP APPOINTMENT)  ?  ? Cardiovascular: CCB + ARB Combos Failed - 03/22/2021 10:51 AM  ?  ?  Failed - K in normal range and within 180 days  ?  Potassium  ?Date Value Ref Range Status  ?09/06/2020 4.6 3.5 - 5.2 mmol/L Final  ?  ?  ?  ?  Failed - Cr in normal range and within 180 days  ?  Creatinine, Ser  ?Date Value Ref Range Status  ?09/06/2020 1.26 0.76 - 1.27 mg/dL Final  ?  ?  ?  ?  Failed - Na in normal range and within 180 days  ?  Sodium  ?Date Value Ref Range Status  ?09/06/2020 141 134 - 144 mmol/L Final  ?  ?  ?  ?  Failed - Last BP in normal range  ?  BP Readings from Last 1 Encounters:  ?05/06/20 (!) 150/94  ?  ?  ?  ?  Failed - Valid encounter within last 6 months  ?  Recent Outpatient Visits   ? ?      ? 10 months ago Annual physical exam  ? [facility]., [facility]  ? 1 year ago Essential (primary) hypertension  ? [facility] Maple Hudson., MD  ? 1 year ago Acute idiopathic gout, unspecified site  ? [facility] Chrismon, Jodell Cipro, PA-C  ? 1 year ago Annual physical exam  ? [facility] Maple Hudson., MD  ? 3 years ago Essential (primary) hypertension  ? [facility] Maple Hudson., MD  ? ?  ?  ? ?  ?  ?  Passed - Patient is not pregnant  ?  ?  ? ? ?

## 2021-04-04 IMAGING — CR DG FOOT COMPLETE 3+V*L*
1 series · 3 of 3 positions shown · non-contrast
Comparison: None.

CLINICAL DATA: Pain and redness of the left first MTP, history of
gout

EXAM:
LEFT FOOT - COMPLETE 3+ VIEW

[Series 1: dg foot complete left · 0.14mm/px · 3 of 3 slices shown]
[im 1/3]
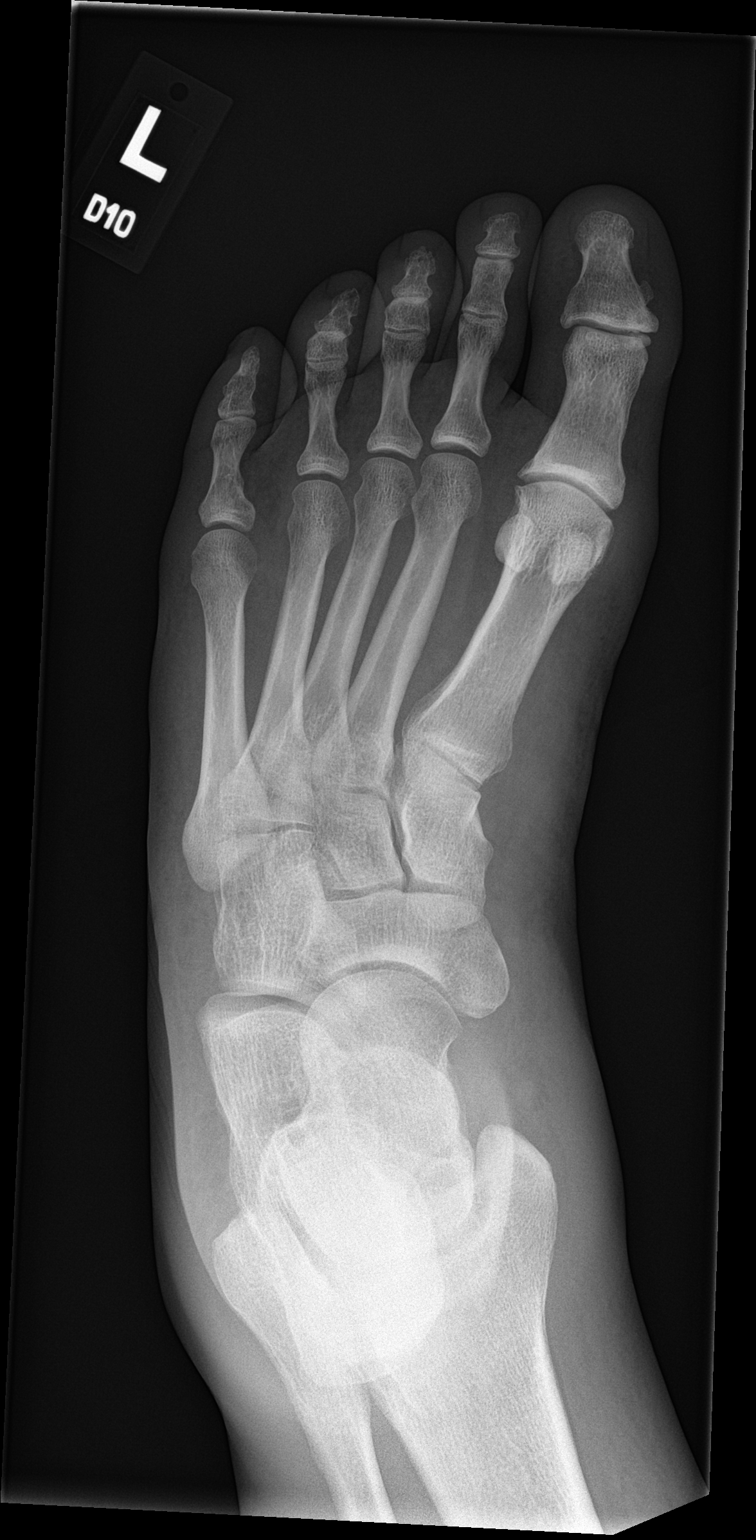
[im 2/3]
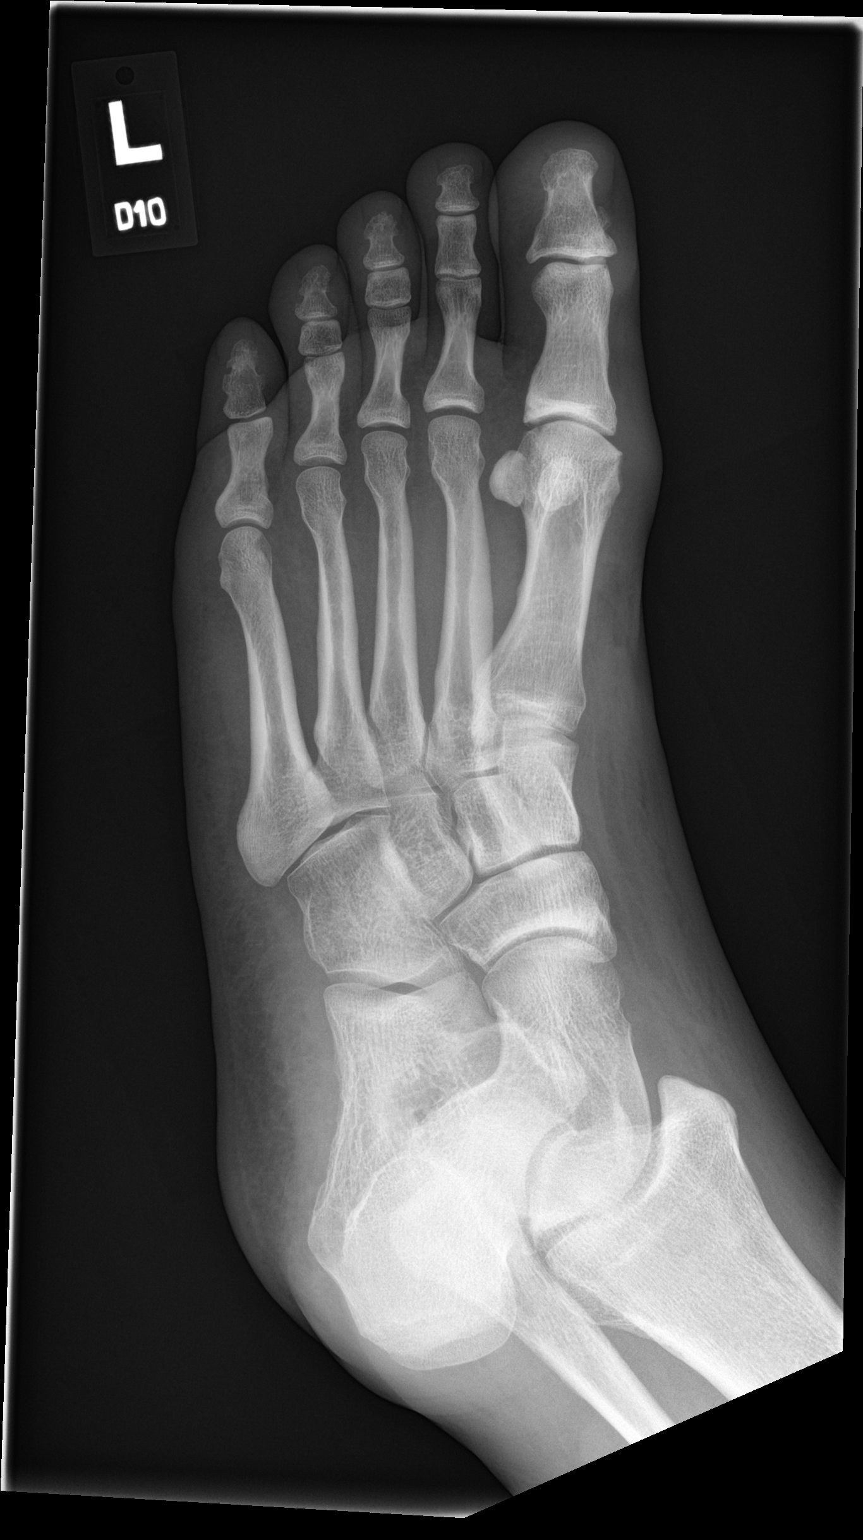
[im 3/3]
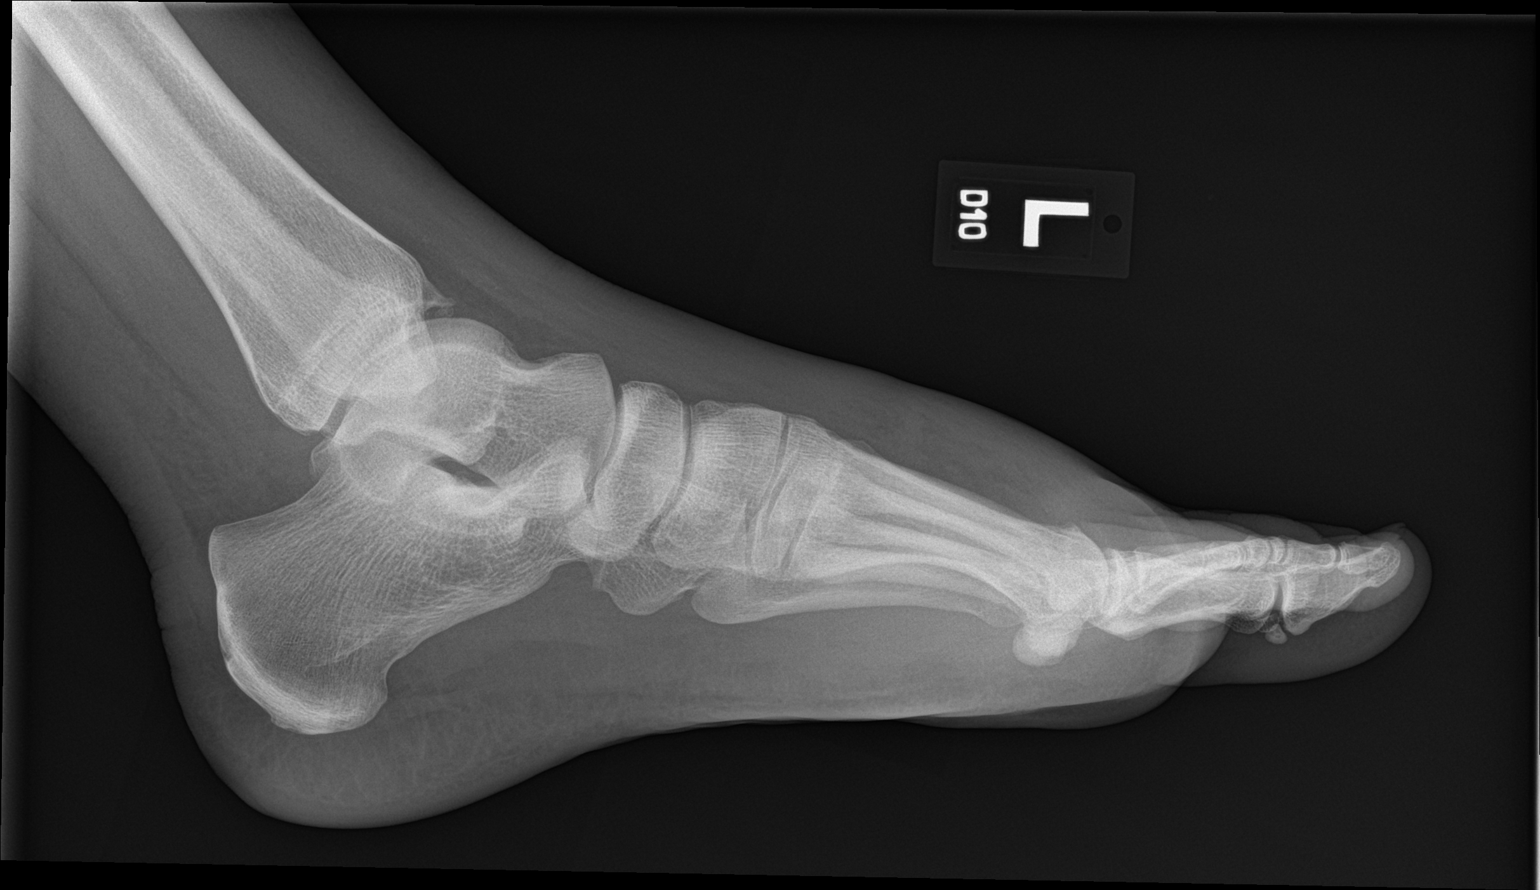

[3 of 3 positions shown; findings below may reference images not displayed]

FINDINGS: There is a possible tiny erosion along the lateral aspect of the
head of first metatarsal. Nodular soft tissue thickening is also
present at the first interphalangeal and metatarsophalangeal joints
as well as some adjacent soft tissue thickening near the first
tarsometatarsal joint. No clear tophaceous mineralization is seen.
No acute osseous abnormality or suspicious osseous lesion. Minimal
degenerative changes are present
IMPRESSION: Nodular soft tissue thickening at the first interphalangeal and
metatarsophalangeal joints as well as a tiny periarticular erosion
along the lateral aspect of the first metatarsal head. Findings
compatible with gout.

## 2021-04-19 NOTE — Progress Notes (Deleted)
?  ? ? ?  Established patient visit ? ? ?Patient: Gabriel Larson   DOB: 1975-08-30   46 y.o. Male  MRN: [id number] ?Visit Date: 04/20/2021 ? ?Today's healthcare provider: Wilhemena Durie, MD  ? ?No chief complaint on file. ? ?Subjective  ?  ?HPI  ?Hypertension, follow-up ? ?BP Readings from Last 3 Encounters:  ?05/06/20 (!) 150/94  ?02/11/20 132/90  ?12/09/19 137/88  ? Wt Readings from Last 3 Encounters:  ?05/06/20 245 lb (111.1 kg)  ?02/11/20 242 lb (109.8 kg)  ?12/09/19 253 lb (114.8 kg)  ?  ? ?He was last seen for hypertension 1 years ago.  ?BP at that visit was as above. Management since that visit includes none. ? ?He reports good compliance with treatment. ?He is not having side effects.  ? ? ?Use of agents associated with hypertension: {bp agents assoc with hypertension:511::"none"}.  ? ?Outside blood pressures are {***enter patient reported home BP readings, or 'not being checked':1}. ?Symptoms: ?{Yes/No:20286} chest pain {Yes/No:20286} chest pressure  ?{Yes/No:20286} palpitations {Yes/No:20286} syncope  ?{Yes/No:20286} dyspnea {Yes/No:20286} orthopnea  ?{Yes/No:20286} paroxysmal nocturnal dyspnea {Yes/No:20286} lower extremity edema  ? ?Pertinent labs ?Lab Results  ?Component Value Date  ? CHOL 174 09/06/2020  ? HDL 32 (L) 09/06/2020  ? [place] 91 09/06/2020  ? TRIG 308 (H) 09/06/2020  ? CHOLHDL 5.4 (H) 09/06/2020  ? Lab Results  ?Component Value Date  ? NA 141 09/06/2020  ? K 4.6 09/06/2020  ? CREATININE 1.26 09/06/2020  ? EGFR 71 09/06/2020  ? GLUCOSE 120 (H) 09/06/2020  ? TSH 1.940 09/06/2020  ?  ? ?The 10-year ASCVD risk score (Arnett DK, et al., 2019) is: 5.3% ? ?--------------------------------------------------------------------------------------------------- ?Health Maintenance ?Patient is due for HIV screening, Hep C screening and colonoscopy-per insurance coverage.  His last PHQ screening/monitoring was done 05/06/20. ? ?Medications: ?Outpatient Medications Prior to Visit  ?Medication Sig  ?  allopurinol (ZYLOPRIM) 100 MG tablet Take 1 tablet (100 mg total) by mouth 2 (two) times daily.  ? amLODipine-olmesartan (AZOR) 5-40 MG tablet TAKE 1 TABLET DAILY (KEEP FOLLOW UP APPOINTMENT)  ? aspirin EC 81 MG tablet Take 81 mg by mouth daily.  ? metoprolol succinate (TOPROL-XL) 100 MG 24 hr tablet TAKE 1 TABLET DAILY WITH OR IMMEDIATELY FOLLOWING A MEAL  ? mometasone (ELOCON) 0.1 % cream Apply 1 application topically daily. Only use 2 weeks at a time  ? MULTIPLE VITAMIN PO Take 1 tablet by mouth daily.  ? venlafaxine XR (EFFEXOR-XR) 150 MG 24 hr capsule TAKE 1 CAPSULE DAILY (KEEP FOLLOW UP APPOINTMENT, NOT SEEN SINCE JULY 2019. APPOINTMENT IS NEEDED FOR FURTHER REFILLS)  ? ?No facility-administered medications prior to visit.  ? ? ?Review of Systems ? ?{Labs  Heme  Chem  Endocrine  Serology  Results Review (optional):23779} ?  Objective  ?  ?There were no vitals taken for this visit. ?{Show previous vital signs (optional):23777} ? ?Physical Exam  ?*** ? ?No results found for any visits on 04/20/21. ? Assessment & Plan  ?  ? ?*** ? ?No follow-ups on file.  ?   ? ?{provider attestation***:1} ? ? ?Richard Cranford Mon, MD  ?[facility] ?[phone] (phone) ?[phone] (fax) ? ?[facility] ?

## 2021-04-20 ENCOUNTER — Ambulatory Visit: Payer: BC Managed Care – PPO | Admitting: Family Medicine

## 2021-04-27 ENCOUNTER — Ambulatory Visit: Payer: BC Managed Care – PPO | Admitting: Family Medicine

## 2021-04-27 ENCOUNTER — Encounter: Payer: Self-pay | Admitting: Family Medicine

## 2021-04-27 VITALS — BP 166/117 | HR 73 | Temp 97.8°F | Resp 16 | Ht 70.0 in | Wt 253.0 lb

## 2021-04-27 DIAGNOSIS — R0683 Snoring: Secondary | ICD-10-CM

## 2021-04-27 DIAGNOSIS — Z6836 Body mass index (BMI) 36.0-36.9, adult: Secondary | ICD-10-CM

## 2021-04-27 DIAGNOSIS — E785 Hyperlipidemia, unspecified: Secondary | ICD-10-CM

## 2021-04-27 DIAGNOSIS — I10 Essential (primary) hypertension: Secondary | ICD-10-CM | POA: Diagnosis not present

## 2021-04-27 DIAGNOSIS — M1000 Idiopathic gout, unspecified site: Secondary | ICD-10-CM | POA: Diagnosis not present

## 2021-04-27 DIAGNOSIS — R5383 Other fatigue: Secondary | ICD-10-CM

## 2021-04-27 DIAGNOSIS — R5381 Other malaise: Secondary | ICD-10-CM

## 2021-04-27 MED ORDER — TRIAMTERENE-HCTZ 37.5-25 MG PO CAPS: 1.0000 | ORAL_CAPSULE | Freq: Every day | ORAL | 3 refills | Status: DC

## 2021-04-27 NOTE — Patient Instructions (Signed)
ASK YOUR WIFE IF YOU QUIT BREATHING WHEN YOUR ASLEEP. ?

## 2021-04-27 NOTE — Progress Notes (Signed)
?  ? ? ?Established patient visit ? ?I,April Miller,acting as a scribe for Wilhemena Durie, MD.,have documented all relevant documentation on the behalf of Wilhemena Durie, MD,as directed by  Wilhemena Durie, MD while in the presence of Wilhemena Durie, MD. ? ? ?Patient: Gabriel Larson   DOB: 04/16/1975   46 y.o. Male  MRN: [id number] ?Visit Date: 04/27/2021 ? ?Today's healthcare provider: Wilhemena Durie, MD  ? ?Chief Complaint  ?Patient presents with  ? Follow-up  ? Hypertension  ? ?Subjective  ?  ?HPI  ?Patient comes in today for follow-up.  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  Usually on the phone all night.  He is tired all the time.  He does snore.  He does not know about any apnea. ?He has no cardiac or neurologic symptoms.  Has not been checking his blood pressure. ?Hypertension, follow-up ? ?BP Readings from Last 3 Encounters:  ?04/27/21 (!) 166/117  ?05/06/20 (!) 150/94  ?02/11/20 132/90  ? Wt Readings from Last 3 Encounters:  ?04/27/21 253 lb (114.8 kg)  ?05/06/20 245 lb (111.1 kg)  ?02/11/20 242 lb (109.8 kg)  ?  ? ?He was last seen for hypertension 1 years ago.  ?BP at that visit was 150/94.  ?Management since that visit includes; Follow-up on blood pressure readings with diet and exercise also. ? ?He reports good compliance with treatment. ?He is not having side effects. none ?He is following a Regular diet. ?He is not exercising. ?He does not smoke. ? ?Use of agents associated with hypertension: none.  ? ?Outside blood pressures are 135/95-130/88. ? ?Pertinent labs ?Lab Results  ?Component Value Date  ? CHOL 174 09/06/2020  ? HDL 32 (L) 09/06/2020  ? [place] 91 09/06/2020  ? TRIG 308 (H) 09/06/2020  ? CHOLHDL 5.4 (H) 09/06/2020  ? Lab Results  ?Component Value Date  ? NA 141 09/06/2020  ? K 4.6 09/06/2020  ? CREATININE 1.26 09/06/2020  ? EGFR 71 09/06/2020  ? GLUCOSE 120 (H) 09/06/2020  ? TSH 1.940 09/06/2020  ?  ? ?The 10-year ASCVD risk score (Arnett  DK, et al., 2019) is: 6.3% ? ?--------------------------------------------------------------------------------------------------- ? ? ?Medications: ?Outpatient Medications Prior to Visit  ?Medication Sig  ? allopurinol (ZYLOPRIM) 100 MG tablet Take 1 tablet (100 mg total) by mouth 2 (two) times daily.  ? amLODipine-olmesartan (AZOR) 5-40 MG tablet TAKE 1 TABLET DAILY (KEEP FOLLOW UP APPOINTMENT)  ? aspirin EC 81 MG tablet Take 81 mg by mouth daily.  ? metoprolol succinate (TOPROL-XL) 100 MG 24 hr tablet TAKE 1 TABLET DAILY WITH OR IMMEDIATELY FOLLOWING A MEAL  ? mometasone (ELOCON) 0.1 % cream Apply 1 application topically daily. Only use 2 weeks at a time  ? MULTIPLE VITAMIN PO Take 1 tablet by mouth daily.  ? venlafaxine XR (EFFEXOR-XR) 150 MG 24 hr capsule TAKE 1 CAPSULE DAILY (KEEP FOLLOW UP APPOINTMENT, NOT SEEN SINCE JULY 2019. APPOINTMENT IS NEEDED FOR FURTHER REFILLS)  ? ?No facility-administered medications prior to visit.  ? ? ?Review of Systems  ?Constitutional:  Negative for appetite change, chills and fever.  ?Respiratory:  Negative for chest tightness, shortness of breath and wheezing.   ?Cardiovascular:  Negative for chest pain and palpitations.  ?Gastrointestinal:  Negative for abdominal pain, nausea and vomiting.  ? ?Last metabolic panel ?Lab Results  ?Component Value Date  ? GLUCOSE 120 (H) 09/06/2020  ? NA 141 09/06/2020  ? K 4.6 09/06/2020  ?  CL 101 09/06/2020  ? CO2 21 09/06/2020  ? BUN 15 09/06/2020  ? CREATININE 1.26 09/06/2020  ? EGFR 71 09/06/2020  ? CALCIUM 8.9 09/06/2020  ? PROT 7.2 09/06/2020  ? ALBUMIN 4.8 09/06/2020  ? LABGLOB 2.4 09/06/2020  ? AGRATIO 2.0 09/06/2020  ? BILITOT 0.9 09/06/2020  ? ALKPHOS 83 09/06/2020  ? AST 28 09/06/2020  ? ALT 36 09/06/2020  ? ANIONGAP 7 02/10/2015  ? ?  ?  Objective  ?  ?BP (!) 166/117 (BP Location: Right Arm, Cuff Size: Large)   Pulse 73   Temp 97.8 ?F (36.6 ?C) (Temporal)   Resp 16   Ht _0  (1.778 m)   Wt 253 lb (114.8 kg)   SpO2 98%   BMI  36.30 kg/m?  ?BP Readings from Last 3 Encounters:  ?04/27/21 (!) 166/117  ?05/06/20 (!) 150/94  ?02/11/20 132/90  ? ?Wt Readings from Last 3 Encounters:  ?04/27/21 253 lb (114.8 kg)  ?05/06/20 245 lb (111.1 kg)  ?02/11/20 242 lb (109.8 kg)  ? ?  ? ?Physical Exam ?Vitals reviewed.  ?Constitutional:   ?   Appearance: He is well-developed.  ?HENT:  ?   Head: Normocephalic and atraumatic.  ?   Right Ear: External ear normal.  ?   Left Ear: External ear normal.  ?   Nose: Nose normal.  ?Eyes:  ?   General: No scleral icterus. ?   Conjunctiva/sclera: Conjunctivae normal.  ?Neck:  ?   Thyroid: No thyromegaly.  ?Cardiovascular:  ?   Rate and Rhythm: Normal rate and regular rhythm.  ?   Heart sounds: Normal heart sounds.  ?Pulmonary:  ?   Effort: Pulmonary effort is normal.  ?   Breath sounds: Normal breath sounds.  ?Abdominal:  ?   Palpations: Abdomen is soft.  ?Musculoskeletal:  ?   Comments: Trace lower extremity edema  ?Skin: ?   General: Skin is warm and dry.  ?Neurological:  ?   Mental Status: He is alert and oriented to person, place, and time.  ?Psychiatric:     ?   Behavior: Behavior normal.     ?   Thought Content: Thought content normal.     ?   Judgment: Judgment normal.  ?  ? ? ? ?  04/27/2021  ? 10:00 AM  ?Results of the [place] flowsheet  ?Sitting and reading 1  ?Watching TV 2  ?Sitting, inactive in a public place (e.g. a theatre or a meeting) 2  ?As a passenger in a car for an hour without a break 2  ?Lying down to rest in the afternoon when circumstances permit 3  ?Sitting and talking to someone 1  ?Sitting quietly after a lunch without alcohol 2  ?In a car, while stopped for a few minutes in traffic 0  ?Total score 13  ? ? ?No results found for any visits on 04/27/21. ? Assessment & Plan  ?  ? ?1. Essential (primary) hypertension ?I had Dyazide every morning and follow-up in 1 to 2 months. ?- triamterene-hydrochlorothiazide (DYAZIDE) 37.5-25 MG capsule; Take 1 each (1 capsule total) by mouth daily.   Dispense: 90 capsule; Refill: 3 ? ?2. Acute idiopathic gout, unspecified site ? ? ?3. Class 2 severe obesity due to excess calories with serious comorbidity and body mass index (BMI) of 36.0 to 36.9 in adult [facility]) ?Diet and exercise stressed. ? ?4. Dyslipidemia ? ? ?5. Snoring ?Patient likely has sleep apnea.  Check on apnea and arrange for home sleep  study ? ?6. Malaise and fatigue ?Multifactorial likely contributed to by sleep apnea. ? ? ?Return in about 4 weeks (around 05/25/2021).  ?   ? ?I, Wilhemena Durie, MD, have reviewed all documentation for this visit. The documentation on 05/04/21 for the exam, diagnosis, procedures, and orders are all accurate and complete. ? ? ? ?Bartosz Luginbill Cranford Mon, MD  ?[facility] ?[phone] (phone) ?[phone] (fax) ? ?[facility] ?

## 2021-05-19 ENCOUNTER — Other Ambulatory Visit: Payer: Self-pay | Admitting: Family Medicine

## 2021-05-19 DIAGNOSIS — I10 Essential (primary) hypertension: Secondary | ICD-10-CM

## 2021-05-19 NOTE — Telephone Encounter (Signed)
Requested Prescriptions  ?Pending Prescriptions Disp Refills  ?? metoprolol succinate (TOPROL-XL) 100 MG 24 hr tablet [Pharmacy Med Name: METOPROLOL SUCCINATE ER TABS 100MG ] 90 tablet 3  ?  Sig: TAKE 1 TABLET DAILY WITH OR IMMEDIATELY FOLLOWING A MEAL  ?  ? Cardiovascular:  Beta Blockers Failed - 05/19/2021  1:28 AM  ?  ?  Failed - Last BP in normal range  ?  BP Readings from Last 1 Encounters:  ?04/27/21 (!) 166/117  ?   ?  ?  Passed - Last Heart Rate in normal range  ?  Pulse Readings from Last 1 Encounters:  ?04/27/21 73  ?   ?  ?  Passed - Valid encounter within last 6 months  ?  Recent Outpatient Visits   ?      ? 3 weeks ago Essential (primary) hypertension  ? [facility]  ? 1 year ago Annual physical exam  ? [facility] MD  ? 1 year ago Essential (primary) hypertension  ? [facility] MD  ? 1 year ago Acute idiopathic gout, unspecified site  ? [facility] [place], [facility], PA-C  ? 2 years ago Annual physical exam  ? [facility] MD  ?  ?  ?Future Appointments   ?        ? In 6 days Maple Hudson., MD [facility]  ?  ? ?  ?  ?  ? ?

## 2021-05-25 ENCOUNTER — Encounter: Payer: Self-pay | Admitting: Family Medicine

## 2021-05-25 ENCOUNTER — Ambulatory Visit: Payer: BC Managed Care – PPO | Admitting: Family Medicine

## 2021-05-25 VITALS — BP 158/98 | HR 80 | Temp 98.6°F | Resp 16 | Ht 70.0 in | Wt 248.8 lb

## 2021-05-25 DIAGNOSIS — Z6835 Body mass index (BMI) 35.0-35.9, adult: Secondary | ICD-10-CM

## 2021-05-25 DIAGNOSIS — I10 Essential (primary) hypertension: Secondary | ICD-10-CM | POA: Diagnosis not present

## 2021-05-25 DIAGNOSIS — G4733 Obstructive sleep apnea (adult) (pediatric): Secondary | ICD-10-CM

## 2021-05-25 MED ORDER — AMLODIPINE-OLMESARTAN 10-40 MG PO TABS: 1.0000 | ORAL_TABLET | Freq: Every day | ORAL | 3 refills | Status: DC

## 2021-05-25 NOTE — Progress Notes (Signed)
Established patient visit   Patient: Gabriel Larson   DOB: [birth year]   46 y.o. Male  MRN: [id number] Visit Date: 05/25/2021  Today's healthcare provider: Wilhemena Durie, MD   I,Tiffany Darlina Sicilian as a scribe for Wilhemena Durie, MD.,have documented all relevant documentation on the behalf of Wilhemena Durie, MD,as directed by  Wilhemena Durie, MD while in the presence of Wilhemena Durie, MD.   Chief Complaint  Patient presents with   Hypertension   Subjective    HPI  Try to get better work hours so he can rest better.  Start exercises on 2. Hypertension, follow-up  BP Readings from Last 3 Encounters:  05/25/21 (!) 158/98  04/27/21 (!) 166/117  05/06/20 (!) 150/94   Wt Readings from Last 3 Encounters:  05/25/21 248 lb 12.8 oz (112.9 kg)  04/27/21 253 lb (114.8 kg)  05/06/20 245 lb (111.1 kg)     He was last seen for hypertension 1 months ago.  Management since that visit includes; started Dyazide every morning.  Outside blood pressures are between 150/95- 130/85.  Pertinent labs Lab Results  Component Value Date   CHOL 174 09/06/2020   HDL 32 (L) 09/06/2020   LDLCALC 91 09/06/2020   TRIG 308 (H) 09/06/2020   CHOLHDL 5.4 (H) 09/06/2020   Lab Results  Component Value Date   NA 141 09/06/2020   K 4.6 09/06/2020   CREATININE 1.26 09/06/2020   EGFR 71 09/06/2020   GLUCOSE 120 (H) 09/06/2020   TSH 1.940 09/06/2020     The 10-year ASCVD risk score (Arnett DK, et al., 2019) is: 5.8%  ---------------------------------------------------------------------------------------------------   Medications: Outpatient Medications Prior to Visit  Medication Sig   allopurinol (ZYLOPRIM) 100 MG tablet Take 1 tablet (100 mg total) by mouth 2 (two) times daily.   amLODipine-olmesartan (AZOR) 5-40 MG tablet TAKE 1 TABLET DAILY (KEEP FOLLOW UP APPOINTMENT)   aspirin EC 81 MG tablet Take 81 mg by mouth daily.   metoprolol succinate (TOPROL-XL) 100  MG 24 hr tablet TAKE 1 TABLET DAILY WITH OR IMMEDIATELY FOLLOWING A MEAL   mometasone (ELOCON) 0.1 % cream Apply 1 application topically daily. Only use 2 weeks at a time   MULTIPLE VITAMIN PO Take 1 tablet by mouth daily.   triamterene-hydrochlorothiazide (DYAZIDE) 37.5-25 MG capsule Take 1 each (1 capsule total) by mouth daily.   venlafaxine XR (EFFEXOR-XR) 150 MG 24 hr capsule TAKE 1 CAPSULE DAILY (KEEP FOLLOW UP APPOINTMENT, NOT SEEN SINCE JULY 2019. APPOINTMENT IS NEEDED FOR FURTHER REFILLS)   No facility-administered medications prior to visit.    Review of Systems  Constitutional:  Negative for appetite change, chills and fever.  Respiratory:  Negative for chest tightness, shortness of breath and wheezing.   Cardiovascular:  Negative for chest pain and palpitations.  Gastrointestinal:  Negative for abdominal pain, nausea and vomiting.   Last thyroid functions Lab Results  Component Value Date   TSH 1.940 09/06/2020       Objective    BP (!) 158/98 (BP Location: Right Arm, Patient Position: Sitting, Cuff Size: Large)   Pulse 80   Temp 98.6 F (37 C) (Oral)   Resp 16   Ht _0  (1.778 m)   Wt 248 lb 12.8 oz (112.9 kg)   SpO2 98%   BMI 35.70 kg/m  BP Readings from Last 3 Encounters:  05/25/21 (!) 158/98  04/27/21 (!) 166/117  05/06/20 (!) 150/94   Wt Readings from  Last 3 Encounters:  05/25/21 248 lb 12.8 oz (112.9 kg)  04/27/21 253 lb (114.8 kg)  05/06/20 245 lb (111.1 kg)      Physical Exam Vitals reviewed.  Constitutional:      Appearance: He is well-developed.  HENT:     Head: Normocephalic and atraumatic.     Right Ear: External ear normal.     Left Ear: External ear normal.     Nose: Nose normal.  Eyes:     General: No scleral icterus.    Conjunctiva/sclera: Conjunctivae normal.  Neck:     Thyroid: No thyromegaly.  Cardiovascular:     Rate and Rhythm: Normal rate and regular rhythm.     Heart sounds: Normal heart sounds.  Pulmonary:     Effort:  Pulmonary effort is normal.     Breath sounds: Normal breath sounds.  Abdominal:     Palpations: Abdomen is soft.  Musculoskeletal:     Comments: Trace lower extremity edema  Skin:    General: Skin is warm and dry.  Neurological:     Mental Status: He is alert and oriented to person, place, and time.  Psychiatric:        Behavior: Behavior normal.        Thought Content: Thought content normal.        Judgment: Judgment normal.      No results found for any visits on 05/25/21.  Assessment & Plan     1. Essential (primary) hypertension [place] Azor to 10/40 daily.  Follow-up in 2 months  2. Obstructive apnea CPAP nightly.  3. Class 2 severe obesity due to excess calories with serious comorbidity and body mass index (BMI) of 35.0 to 35.9 in adult [facility]) WorkHard on diet and exercises I think weight loss would help all his medical problems.   No follow-ups on file.      I, Wilhemena Durie, MD, have reviewed all documentation for this visit. The documentation on 05/28/21 for the exam, diagnosis, procedures, and orders are all accurate and complete.    Terrace Chiem Cranford Mon, MD  [facility] [phone] (phone) [phone] (fax)  [place]

## 2021-06-14 ENCOUNTER — Other Ambulatory Visit: Payer: Self-pay | Admitting: Family Medicine

## 2021-06-20 ENCOUNTER — Other Ambulatory Visit: Payer: Self-pay | Admitting: Family Medicine

## 2021-07-22 NOTE — Progress Notes (Unsigned)
      Established patient visit   Patient: Gabriel Larson   DOB: 1975-02-10   46 y.o. Male  MRN: [id number] Visit Date: 07/25/2021  Today's healthcare provider: Wilhemena Durie, MD   No chief complaint on file.  Subjective    HPI  Hypertension, follow-up  BP Readings from Last 3 Encounters:  05/25/21 (!) 158/98  04/27/21 (!) 166/117  05/06/20 (!) 150/94   Wt Readings from Last 3 Encounters:  05/25/21 248 lb 12.8 oz (112.9 kg)  04/27/21 253 lb (114.8 kg)  05/06/20 245 lb (111.1 kg)     He was last seen for hypertension 2 months ago.  Management since that visit includes; increased Azor to 10/40 daily.  Follow-up in 2 months.  Outside blood pressures are {***enter patient reported home BP readings, or 'not being checked':1}.  Pertinent labs Lab Results  Component Value Date   CHOL 174 09/06/2020   HDL 32 (L) 09/06/2020   LDLCALC 91 09/06/2020   TRIG 308 (H) 09/06/2020   CHOLHDL 5.4 (H) 09/06/2020   Lab Results  Component Value Date   NA 141 09/06/2020   K 4.6 09/06/2020   CREATININE 1.26 09/06/2020   EGFR 71 09/06/2020   GLUCOSE 120 (H) 09/06/2020   TSH 1.940 09/06/2020     The 10-year ASCVD risk score (Arnett DK, et al., 2019) is: 5.8%  ---------------------------------------------------------------------------------------------------   Medications: Outpatient Medications Prior to Visit  Medication Sig   allopurinol (ZYLOPRIM) 100 MG tablet Take 1 tablet (100 mg total) by mouth 2 (two) times daily.   amLODipine-olmesartan (AZOR) 10-40 MG tablet Take 1 tablet by mouth daily.   amLODipine-olmesartan (AZOR) 5-40 MG tablet TAKE 1 TABLET DAILY (KEEP FOLLOW UP APPOINTMENT)   aspirin EC 81 MG tablet Take 81 mg by mouth daily.   metoprolol succinate (TOPROL-XL) 100 MG 24 hr tablet TAKE 1 TABLET DAILY WITH OR IMMEDIATELY FOLLOWING A MEAL   mometasone (ELOCON) 0.1 % cream Apply 1 application topically daily. Only use 2 weeks at a time   MULTIPLE VITAMIN PO  Take 1 tablet by mouth daily.   triamterene-hydrochlorothiazide (DYAZIDE) 37.5-25 MG capsule Take 1 each (1 capsule total) by mouth daily.   venlafaxine XR (EFFEXOR-XR) 150 MG 24 hr capsule TAKE 1 CAPSULE DAILY (KEEP FOLLOW UP APPOINTMENT, NOT SEEN SINCE JULY 2019. APPOINTMENT IS NEEDED FOR FURTHER REFILLS)   No facility-administered medications prior to visit.    Review of Systems  {Labs  Heme  Chem  Endocrine  Serology  Results Review (optional):23779}   Objective    There were no vitals taken for this visit. {Show previous vital signs (optional):23777}  Physical Exam  ***  No results found for any visits on 07/25/21.  Assessment & Plan     ***  No follow-ups on file.      {provider attestation***:1}   Wilhemena Durie, MD  [facility] [phone] (phone) [phone] (fax)  [place]

## 2021-07-25 ENCOUNTER — Ambulatory Visit: Payer: BC Managed Care – PPO | Admitting: Family Medicine

## 2021-07-25 VITALS — BP 166/111 | HR 86 | Temp 98.6°F | Wt 251.0 lb

## 2021-07-25 DIAGNOSIS — I10 Essential (primary) hypertension: Secondary | ICD-10-CM

## 2021-07-25 DIAGNOSIS — F4323 Adjustment disorder with mixed anxiety and depressed mood: Secondary | ICD-10-CM

## 2021-07-25 DIAGNOSIS — G4733 Obstructive sleep apnea (adult) (pediatric): Secondary | ICD-10-CM

## 2021-07-25 DIAGNOSIS — Z6835 Body mass index (BMI) 35.0-35.9, adult: Secondary | ICD-10-CM

## 2021-07-25 MED ORDER — AMLODIPINE-OLMESARTAN 10-40 MG PO TABS: 1.0000 | ORAL_TABLET | Freq: Every day | ORAL | 3 refills | Status: AC

## 2021-08-17 ENCOUNTER — Other Ambulatory Visit: Payer: Self-pay | Admitting: Family Medicine

## 2021-08-17 DIAGNOSIS — I10 Essential (primary) hypertension: Secondary | ICD-10-CM

## 2021-08-24 ENCOUNTER — Encounter: Payer: Self-pay | Admitting: Family Medicine

## 2021-08-24 ENCOUNTER — Ambulatory Visit: Payer: BC Managed Care – PPO | Admitting: Family Medicine

## 2021-08-24 VITALS — BP 133/97 | HR 84 | Resp 16 | Wt 250.0 lb

## 2021-08-24 DIAGNOSIS — Z6835 Body mass index (BMI) 35.0-35.9, adult: Secondary | ICD-10-CM

## 2021-08-24 DIAGNOSIS — G4733 Obstructive sleep apnea (adult) (pediatric): Secondary | ICD-10-CM | POA: Diagnosis not present

## 2021-08-24 DIAGNOSIS — F4323 Adjustment disorder with mixed anxiety and depressed mood: Secondary | ICD-10-CM | POA: Diagnosis not present

## 2021-08-24 DIAGNOSIS — E785 Hyperlipidemia, unspecified: Secondary | ICD-10-CM

## 2021-08-24 DIAGNOSIS — I10 Essential (primary) hypertension: Secondary | ICD-10-CM

## 2021-08-24 NOTE — Progress Notes (Signed)
Established patient visit  I,April Miller,acting as a scribe for Wilhemena Durie, MD.,have documented all relevant documentation on the behalf of Wilhemena Durie, MD,as directed by  Wilhemena Durie, MD while in the presence of Wilhemena Durie, MD.   Patient: Gabriel Larson   DOB: [birth year]   46 y.o. Male  MRN: [id number] Visit Date: 08/24/2021  Today's healthcare provider: Wilhemena Durie, MD   Chief Complaint  Patient presents with   Follow-up   Hypertension   Subjective    HPI  Patient feels well but still has long hours through the night working and is not exercising yet.  Hypertension, follow-up  BP Readings from Last 3 Encounters:  08/24/21 (!) 133/97  07/25/21 (!) 166/111  05/25/21 (!) 158/98   Wt Readings from Last 3 Encounters:  08/24/21 250 lb (113.4 kg)  07/25/21 251 lb (113.9 kg)  05/25/21 248 lb 12.8 oz (112.9 kg)     He was last seen for hypertension 1 months ago.  Management since that visit includes; Given amlodipine 10-40 mg. Follow up in one month.  If on next visit blood pressures are not markedly improved we will add hydralazine twice a day.  Outside blood pressures are 150/100-140/90.  Pertinent labs Lab Results  Component Value Date   CHOL 174 09/06/2020   HDL 32 (L) 09/06/2020   LDLCALC 91 09/06/2020   TRIG 308 (H) 09/06/2020   CHOLHDL 5.4 (H) 09/06/2020   Lab Results  Component Value Date   NA 141 09/06/2020   K 4.6 09/06/2020   CREATININE 1.26 09/06/2020   EGFR 71 09/06/2020   GLUCOSE 120 (H) 09/06/2020   TSH 1.940 09/06/2020     The 10-year ASCVD risk score (Arnett DK, et al., 2019) is: 4.3%  ---------------------------------------------------------------------------------------------------   Medications: Outpatient Medications Prior to Visit  Medication Sig   allopurinol (ZYLOPRIM) 100 MG tablet Take 1 tablet (100 mg total) by mouth 2 (two) times daily.   amLODipine-olmesartan (AZOR) 10-40 MG tablet  Take 1 tablet by mouth daily.   aspirin EC 81 MG tablet Take 81 mg by mouth daily.   metoprolol succinate (TOPROL-XL) 100 MG 24 hr tablet TAKE 1 TABLET DAILY WITH OR IMMEDIATELY FOLLOWING A MEAL   mometasone (ELOCON) 0.1 % cream Apply 1 application topically daily. Only use 2 weeks at a time   MULTIPLE VITAMIN PO Take 1 tablet by mouth daily.   venlafaxine XR (EFFEXOR-XR) 150 MG 24 hr capsule TAKE 1 CAPSULE DAILY (KEEP FOLLOW UP APPOINTMENT, NOT SEEN SINCE JULY 2019. APPOINTMENT IS NEEDED FOR FURTHER REFILLS)   No facility-administered medications prior to visit.    Review of Systems  Constitutional:  Negative for appetite change, chills and fever.  Respiratory:  Negative for chest tightness, shortness of breath and wheezing.   Cardiovascular:  Negative for chest pain and palpitations.  Gastrointestinal:  Negative for abdominal pain, nausea and vomiting.        Objective    BP (!) 133/97 (BP Location: Left Arm, Patient Position: Sitting, Cuff Size: Large)   Pulse 84   Resp 16   Wt 250 lb (113.4 kg)   SpO2 98%   BMI 35.87 kg/m  BP Readings from Last 3 Encounters:  08/24/21 (!) 133/97  07/25/21 (!) 166/111  05/25/21 (!) 158/98   Wt Readings from Last 3 Encounters:  08/24/21 250 lb (113.4 kg)  07/25/21 251 lb (113.9 kg)  05/25/21 248 lb 12.8 oz (112.9 kg)  Physical Exam Vitals reviewed.  Constitutional:      Appearance: He is well-developed.  HENT:     Head: Normocephalic and atraumatic.     Right Ear: External ear normal.     Left Ear: External ear normal.     Nose: Nose normal.  Eyes:     General: No scleral icterus.    Conjunctiva/sclera: Conjunctivae normal.  Neck:     Thyroid: No thyromegaly.  Cardiovascular:     Rate and Rhythm: Normal rate and regular rhythm.     Heart sounds: Normal heart sounds.  Pulmonary:     Effort: Pulmonary effort is normal.     Breath sounds: Normal breath sounds.  Abdominal:     Palpations: Abdomen is soft.   Musculoskeletal:     Comments: Trace lower extremity edema  Skin:    General: Skin is warm and dry.  Neurological:     Mental Status: He is alert and oriented to person, place, and time.  Psychiatric:        Behavior: Behavior normal.        Thought Content: Thought content normal.        Judgment: Judgment normal.       No results found for any visits on 08/24/21.  Assessment & Plan     1. Essential (primary) hypertension Moving control.  I really think with patient get back to regular exercise and lose weight it will come down to appropriate level.  He wishes to try this, he will check his home blood pressure readings  2. Obstructive apnea On CPAP  3. Class 2 severe obesity due to excess calories with serious comorbidity and body mass index (BMI) of 35.0 to 35.9 in adult [facility]) With hypertension OSA and hyperlipidemia  4. Adjustment disorder with mixed anxiety and depressed mood On Effexor for multiple years, stable  5. Dyslipidemia    No follow-ups on file.      I, Wilhemena Durie, MD, have reviewed all documentation for this visit. The documentation on 08/29/21 for the exam, diagnosis, procedures, and orders are all accurate and complete.    Madason Rauls Cranford Mon, MD  [facility] [phone] (phone) [phone] (fax)  [place]

## 2021-11-10 ENCOUNTER — Telehealth: Payer: Self-pay | Admitting: Family Medicine

## 2021-11-10 NOTE — Telephone Encounter (Signed)
Called patient to schedule appt/ have him come in for labs. Okay for PEC to advise.

## 2021-11-10 NOTE — Telephone Encounter (Signed)
Called, left VM to call back to schedule OV. 

## 2021-11-10 NOTE — Telephone Encounter (Signed)
[facility] faxed refill request for the following medications:  amLODipine-olmesartan (AZOR) 10-40 MG tablet    Please advise.

## 2022-04-18 DIAGNOSIS — R739 Hyperglycemia, unspecified: Secondary | ICD-10-CM | POA: Diagnosis not present

## 2022-04-18 DIAGNOSIS — R972 Elevated prostate specific antigen [PSA]: Secondary | ICD-10-CM | POA: Diagnosis not present

## 2022-04-18 DIAGNOSIS — G4733 Obstructive sleep apnea (adult) (pediatric): Secondary | ICD-10-CM | POA: Diagnosis not present

## 2022-04-18 DIAGNOSIS — I10 Essential (primary) hypertension: Secondary | ICD-10-CM | POA: Diagnosis not present

## 2022-04-18 DIAGNOSIS — E785 Hyperlipidemia, unspecified: Secondary | ICD-10-CM | POA: Diagnosis not present

## 2022-04-18 DIAGNOSIS — E669 Obesity, unspecified: Secondary | ICD-10-CM | POA: Diagnosis not present

## 2022-04-18 DIAGNOSIS — Z0000 Encounter for general adult medical examination without abnormal findings: Secondary | ICD-10-CM | POA: Diagnosis not present

## 2022-05-25 DIAGNOSIS — Z1211 Encounter for screening for malignant neoplasm of colon: Secondary | ICD-10-CM | POA: Diagnosis not present

## 2022-05-25 DIAGNOSIS — G4733 Obstructive sleep apnea (adult) (pediatric): Secondary | ICD-10-CM | POA: Diagnosis not present

## 2022-05-25 DIAGNOSIS — E669 Obesity, unspecified: Secondary | ICD-10-CM | POA: Diagnosis not present

## 2022-05-25 DIAGNOSIS — I10 Essential (primary) hypertension: Secondary | ICD-10-CM | POA: Diagnosis not present

## 2022-06-29 DIAGNOSIS — G4733 Obstructive sleep apnea (adult) (pediatric): Secondary | ICD-10-CM | POA: Diagnosis not present

## 2022-07-20 DIAGNOSIS — E669 Obesity, unspecified: Secondary | ICD-10-CM | POA: Diagnosis not present

## 2022-07-20 DIAGNOSIS — G4733 Obstructive sleep apnea (adult) (pediatric): Secondary | ICD-10-CM | POA: Diagnosis not present

## 2022-07-20 DIAGNOSIS — F3341 Major depressive disorder, recurrent, in partial remission: Secondary | ICD-10-CM | POA: Diagnosis not present

## 2022-07-20 DIAGNOSIS — I10 Essential (primary) hypertension: Secondary | ICD-10-CM | POA: Diagnosis not present
